# Patient Record
Sex: Female | Born: 1970 | Race: Black or African American | Hispanic: No | Marital: Married | Smoking: Never smoker
Health system: Southern US, Community
[De-identification: ages and names within clinical notes are randomized; demographics above are authoritative.]

## PROBLEM LIST (undated history)

## (undated) DIAGNOSIS — E049 Nontoxic goiter, unspecified: Secondary | ICD-10-CM

## (undated) DIAGNOSIS — I10 Essential (primary) hypertension: Secondary | ICD-10-CM

## (undated) DIAGNOSIS — D649 Anemia, unspecified: Secondary | ICD-10-CM

## (undated) DIAGNOSIS — E119 Type 2 diabetes mellitus without complications: Secondary | ICD-10-CM

## (undated) HISTORY — PX: DILITATION & CURRETTAGE/HYSTROSCOPY WITH NOVASURE ABLATION: SHX5568

## (undated) HISTORY — DX: Type 2 diabetes mellitus without complications: E11.9

## (undated) HISTORY — PX: WISDOM TOOTH EXTRACTION: SHX21

## (undated) HISTORY — DX: Essential (primary) hypertension: I10

## (undated) HISTORY — DX: Morbid (severe) obesity due to excess calories: E66.01

---

## 2005-12-29 ENCOUNTER — Ambulatory Visit (HOSPITAL_COMMUNITY): Admission: RE | Admit: 2005-12-29 | Discharge: 2005-12-29 | Payer: Self-pay | Admitting: Surgery

## 2007-01-10 ENCOUNTER — Ambulatory Visit (HOSPITAL_COMMUNITY): Admission: RE | Admit: 2007-01-10 | Discharge: 2007-01-10 | Payer: Self-pay | Admitting: Surgery

## 2009-12-28 ENCOUNTER — Ambulatory Visit (HOSPITAL_COMMUNITY): Admission: RE | Admit: 2009-12-28 | Discharge: 2009-12-28 | Payer: Self-pay | Admitting: Obstetrics and Gynecology

## 2010-01-25 ENCOUNTER — Ambulatory Visit (HOSPITAL_COMMUNITY): Admission: RE | Admit: 2010-01-25 | Discharge: 2010-01-25 | Payer: Self-pay | Admitting: Obstetrics and Gynecology

## 2010-02-03 ENCOUNTER — Encounter: Admission: RE | Admit: 2010-02-03 | Discharge: 2010-02-03 | Payer: Self-pay | Admitting: Obstetrics and Gynecology

## 2010-04-08 ENCOUNTER — Inpatient Hospital Stay (HOSPITAL_COMMUNITY): Admission: AD | Admit: 2010-04-08 | Discharge: 2010-04-11 | Payer: Self-pay | Admitting: Obstetrics and Gynecology

## 2010-04-09 ENCOUNTER — Encounter (INDEPENDENT_AMBULATORY_CARE_PROVIDER_SITE_OTHER): Payer: Self-pay | Admitting: Obstetrics and Gynecology

## 2010-04-10 ENCOUNTER — Encounter (INDEPENDENT_AMBULATORY_CARE_PROVIDER_SITE_OTHER): Payer: Self-pay | Admitting: Obstetrics and Gynecology

## 2010-04-22 HISTORY — PX: TUBAL LIGATION: SHX77

## 2010-11-05 LAB — CBC
HCT: 32.1 % — ABNORMAL LOW (ref 36.0–46.0)
HCT: 38.7 % (ref 36.0–46.0)
Hemoglobin: 10.8 g/dL — ABNORMAL LOW (ref 12.0–15.0)
MCH: 30 pg (ref 26.0–34.0)
MCV: 91 fL (ref 78.0–100.0)
MCV: 91.1 fL (ref 78.0–100.0)
RBC: 4.25 MIL/uL (ref 3.87–5.11)
WBC: 10.4 10*3/uL (ref 4.0–10.5)
WBC: 12.7 10*3/uL — ABNORMAL HIGH (ref 4.0–10.5)

## 2011-01-05 ENCOUNTER — Encounter (INDEPENDENT_AMBULATORY_CARE_PROVIDER_SITE_OTHER): Payer: Self-pay | Admitting: Surgery

## 2011-03-18 ENCOUNTER — Encounter (INDEPENDENT_AMBULATORY_CARE_PROVIDER_SITE_OTHER): Payer: Self-pay | Admitting: Surgery

## 2011-03-21 ENCOUNTER — Encounter (INDEPENDENT_AMBULATORY_CARE_PROVIDER_SITE_OTHER): Payer: Self-pay | Admitting: Surgery

## 2011-03-21 ENCOUNTER — Ambulatory Visit
Admission: RE | Admit: 2011-03-21 | Discharge: 2011-03-21 | Disposition: A | Discharge status: Home / Self Care | Payer: BC Managed Care – PPO | Source: Ambulatory Visit | Attending: Surgery | Admitting: Surgery

## 2011-03-21 ENCOUNTER — Ambulatory Visit (INDEPENDENT_AMBULATORY_CARE_PROVIDER_SITE_OTHER): Payer: BC Managed Care – PPO | Admitting: Surgery

## 2011-03-21 ENCOUNTER — Other Ambulatory Visit (INDEPENDENT_AMBULATORY_CARE_PROVIDER_SITE_OTHER): Payer: Self-pay | Admitting: Surgery

## 2011-03-21 VITALS — BP 132/86 | HR 62 | Temp 96.4°F | Ht 66.0 in | Wt 249.8 lb

## 2011-03-21 DIAGNOSIS — E042 Nontoxic multinodular goiter: Secondary | ICD-10-CM

## 2011-03-21 NOTE — Progress Notes (Signed)
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 She has been followed in my practice for several years. She has no specific complaints. She has had no recent diagnostic studies other than her ultrasound which was performed today. She has never been on thyroid medication.   PERTINENT REVIEW OF SYSTEMS: Patient denies any compressive symptoms. She denies any dysphagia. She denies tremors. She denies palpitations.   EXAM: HEENT shows her to be normocephalic. Sclerae clear. Pupils equal and reactive. Dentition good. Mucous membranes moist. Voice is normal. Neck is symmetric with extension. Palpation reveals a generous-sized thyroid gland which is nontender. There are no palpable nodules. There are no masses. There is no anterior or posterior cervical lymphadenopathy. There are no supraclavicular masses. Chest is clear to auscultation bilaterally without rales rhonchi or wheeze Cardiac exam shows regular rate and rhythm without murmur peripheral pulses are full Extremities are nontender without edema Neurologically the patient is alert and oriented without focal deficit there is no sign of tremor.   IMPRESSION: Thyroid goiter with bilateral subcentimeter cystic nodules, clinically stable   PLAN: Patient will have a TSH level obtained by her primary care physician this fall. She will forward those results to me for review. The patient will return in 2 years her physical examination. We will repeat her thyroid ultrasound prior to that office visit in

## 2012-09-17 IMAGING — US US SOFT TISSUE HEAD/NECK
1 series · 14 of 25 positions shown · non-contrast
Comparison: 01/10/2007.

CLINICAL DATA: Multiple thyroid nodules.

THYROID ULTRASOUND
TECHNIQUE: Ultrasound examination of the thyroid gland and adjacent
soft tissues was performed.

[Series 1: us soft tissue head/neck · 0.06mm/px · 14 of 47 slices shown]
[im 1/47]
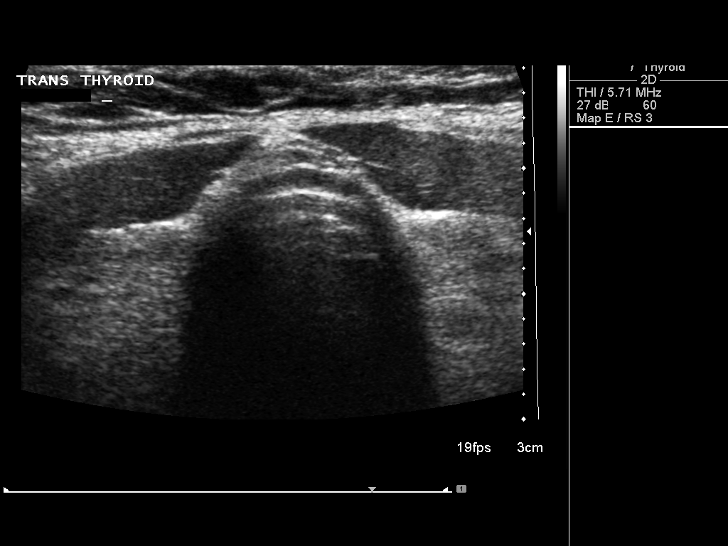
[im 4/47]
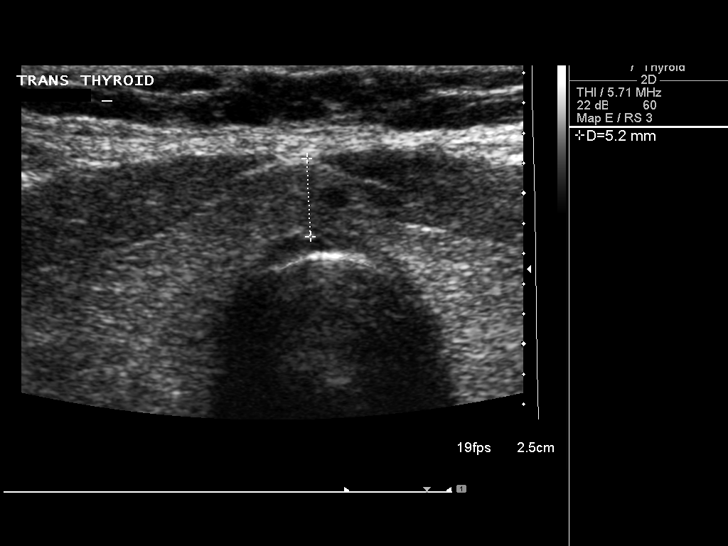
[im 8/47]
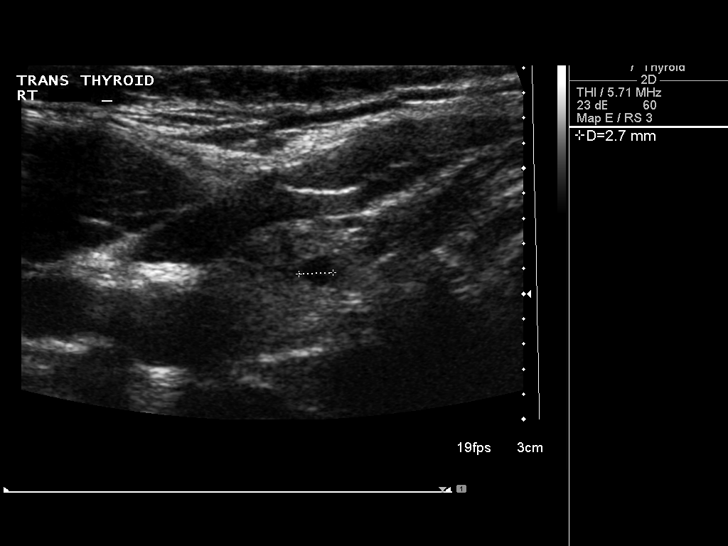
[im 12/47]
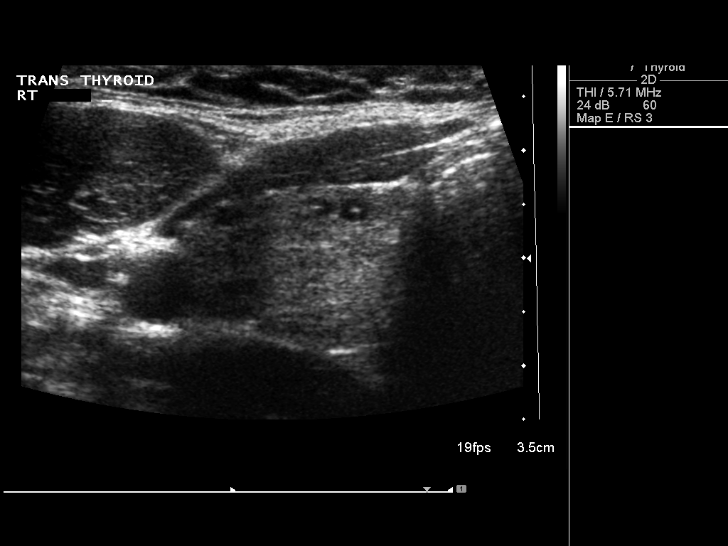
[im 16/47]
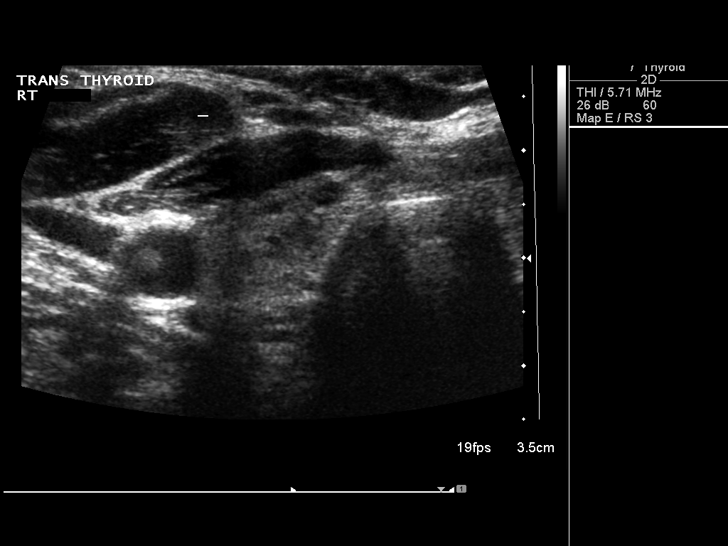
[im 18/47]
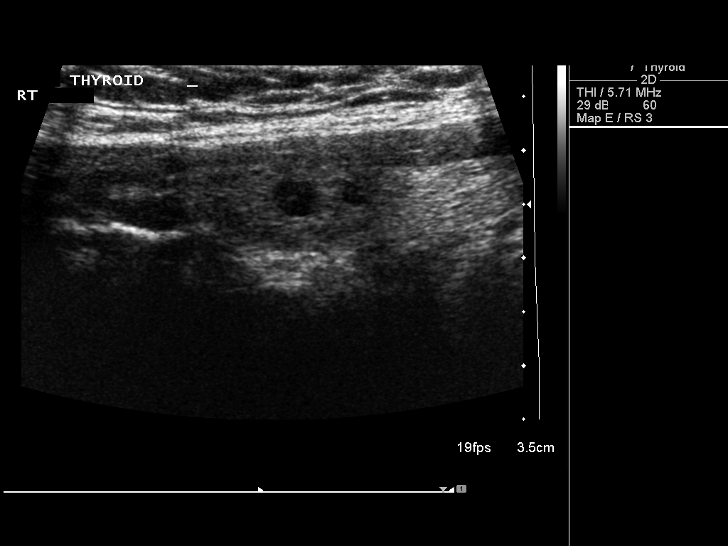
[im 22/47]
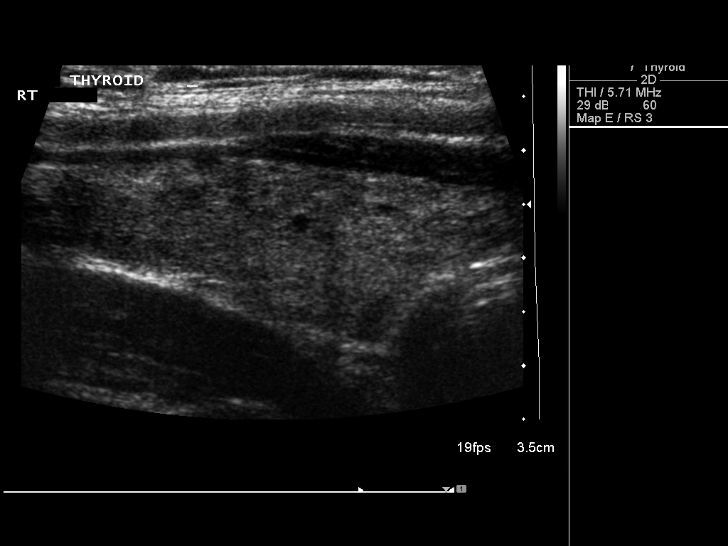
[im 25/47]
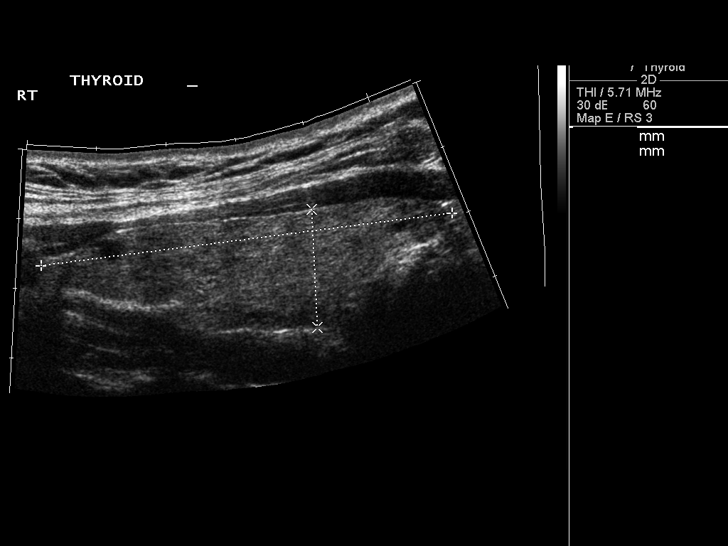
[im 29/47]
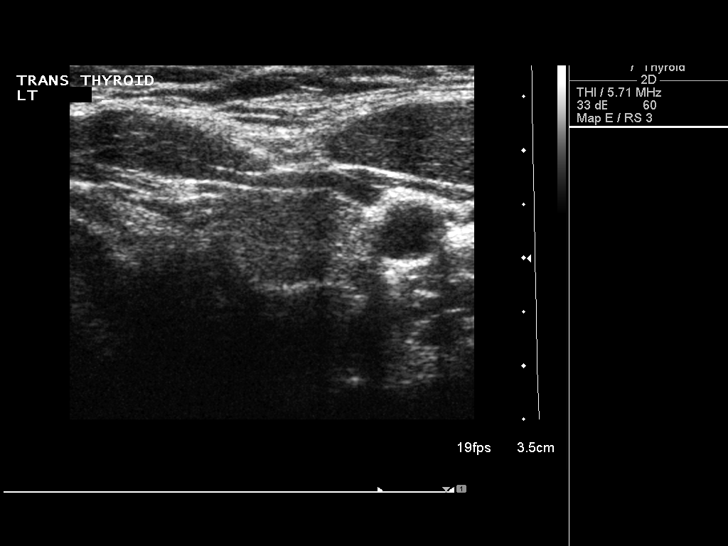
[im 31/47]
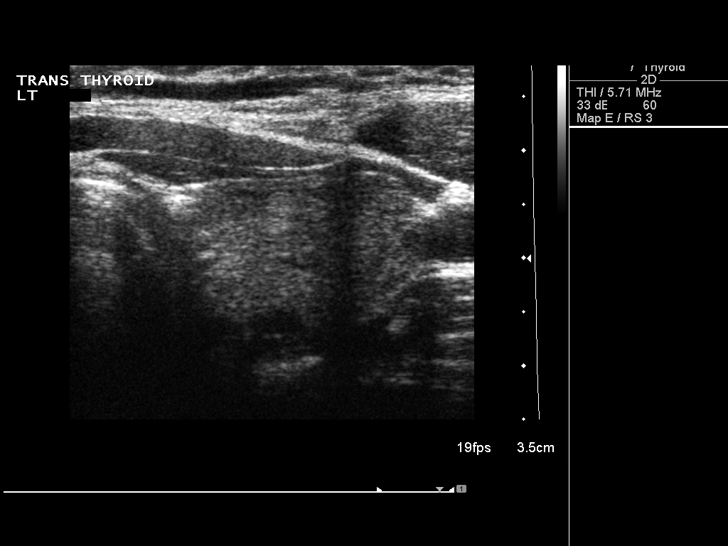
[im 35/47]
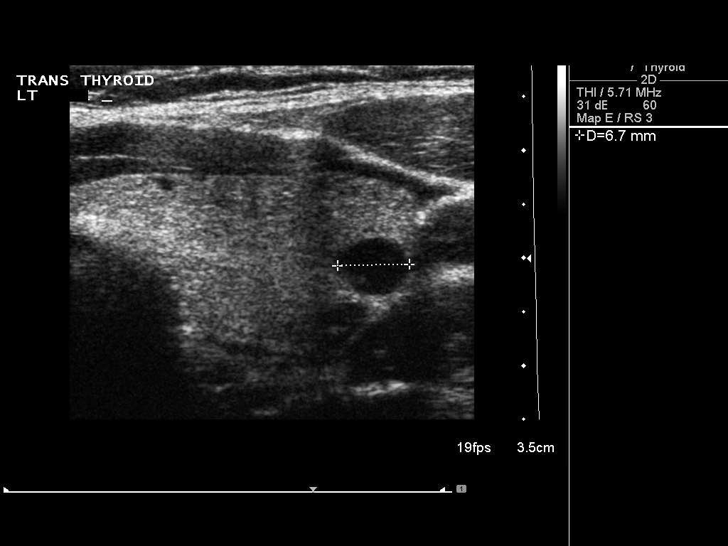
[im 39/47]
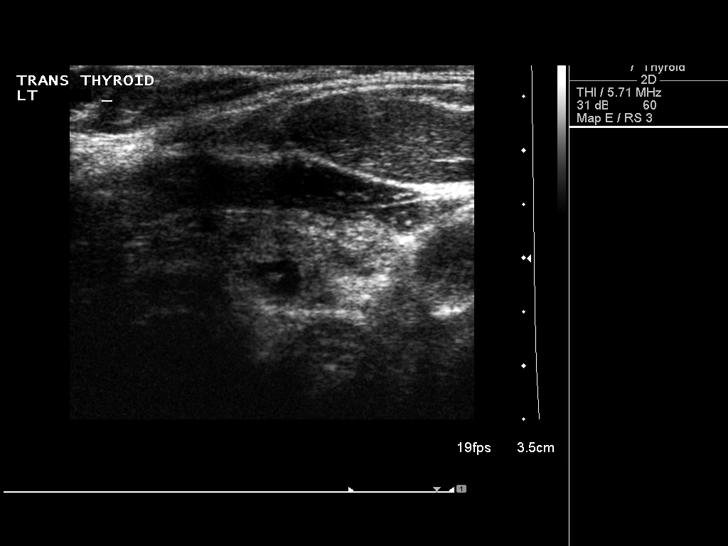
[im 43/47]
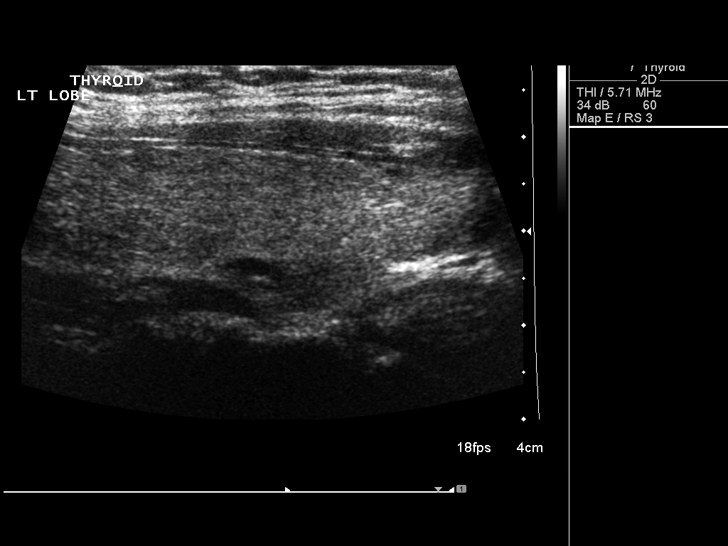
[im 47/47]
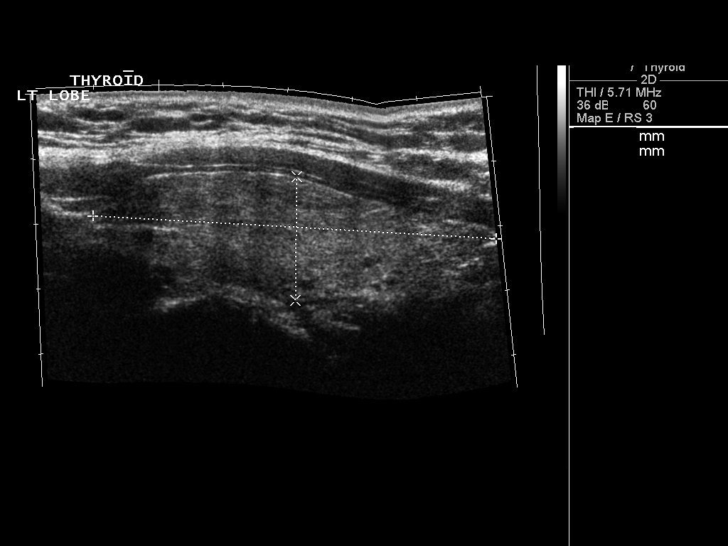

[14 of 25 positions shown; findings below may reference images not displayed]

FINDINGS: Right thyroid lobe:  Measures 5.9 x 1.7 x 2.1 cm (previously 5.4 x
1.6 x 1.8 cm) and is fairly homogeneous in echotexture.
Left thyroid lobe:  Measures 6.2 x 1.9 x 2.4 cm (previously 5.5 x
1.8 x 2.1 cm) and is fairly homogeneous in echotexture.
Isthmus:  Measures 5 mm.

Focal nodules:  Cystic appearing nodules are seen bilaterally,
measuring up to 8 x 6 x 7 mm in the lateral left mid pole, stable.

Lymphadenopathy:  None visualized.
IMPRESSION: Scattered sub centimeter cystic nodules, as before.

## 2013-04-04 ENCOUNTER — Telehealth (INDEPENDENT_AMBULATORY_CARE_PROVIDER_SITE_OTHER): Payer: Self-pay | Admitting: General Surgery

## 2013-04-04 NOTE — Telephone Encounter (Signed)
Spoke with pt and informed her that she is set up for her [place] on 04/12/13 at 2:45 at [facility] radiology.  Also explained she has a f/u appt w/ Gerkin on 04/23/13 at 3:00.

## 2013-04-12 ENCOUNTER — Ambulatory Visit (HOSPITAL_COMMUNITY)
Admission: RE | Admit: 2013-04-12 | Discharge: 2013-04-12 | Disposition: A | Discharge status: Home / Self Care | Payer: BC Managed Care – PPO | Source: Ambulatory Visit | Attending: Surgery | Admitting: Surgery

## 2013-04-12 DIAGNOSIS — E042 Nontoxic multinodular goiter: Secondary | ICD-10-CM

## 2013-04-23 ENCOUNTER — Ambulatory Visit (INDEPENDENT_AMBULATORY_CARE_PROVIDER_SITE_OTHER): Payer: BC Managed Care – PPO | Admitting: Surgery

## 2013-06-05 ENCOUNTER — Ambulatory Visit (INDEPENDENT_AMBULATORY_CARE_PROVIDER_SITE_OTHER): Payer: BC Managed Care – PPO | Admitting: Surgery

## 2013-07-23 ENCOUNTER — Ambulatory Visit (INDEPENDENT_AMBULATORY_CARE_PROVIDER_SITE_OTHER): Payer: BC Managed Care – PPO | Admitting: Surgery

## 2013-09-16 ENCOUNTER — Ambulatory Visit (INDEPENDENT_AMBULATORY_CARE_PROVIDER_SITE_OTHER): Payer: BC Managed Care – PPO | Admitting: Surgery

## 2013-10-09 ENCOUNTER — Ambulatory Visit (INDEPENDENT_AMBULATORY_CARE_PROVIDER_SITE_OTHER): Payer: BC Managed Care – PPO | Admitting: Surgery

## 2013-11-06 ENCOUNTER — Ambulatory Visit (INDEPENDENT_AMBULATORY_CARE_PROVIDER_SITE_OTHER): Payer: BC Managed Care – PPO | Admitting: Surgery

## 2013-11-06 ENCOUNTER — Encounter (INDEPENDENT_AMBULATORY_CARE_PROVIDER_SITE_OTHER): Payer: Self-pay | Admitting: Surgery

## 2013-11-06 VITALS — BP 120/82 | HR 80 | Temp 98.2°F | Resp 14 | Ht 65.0 in | Wt 247.6 lb

## 2013-11-06 DIAGNOSIS — E042 Nontoxic multinodular goiter: Secondary | ICD-10-CM

## 2013-11-06 NOTE — Progress Notes (Signed)
General Surgery [facility]  Chief Complaint  Patient presents with  . 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 She has been followed intermittently for several years. Her most recent thyroid ultrasound was performed in August 2014. This shows a normal size right thyroid lobe at 4.3 cm containing multiple 4 mm hypoechoic nodules. The left thyroid lobe is slightly enlarged at 5.3 cm and contains multiple hypoechoic nodules with the largest measuring 8 mm. All of these findings are stable compared to prior examinations. No worrisome findings were identified. No indication for biopsy.  Patient has a new priamry care physician in [place], [place]. They follow her TSH levels.  PERTINENT REVIEW OF SYSTEMS: Denies compressive symptoms. Denies tremor. Denies palpitations.  EXAM: HEENT: normocephalic; pupils equal and reactive; sclerae clear; dentition good; mucous membranes moist NECK:  No palpable nodules in the thyroid bed; symmetric on extension; no palpable anterior or posterior cervical lymphadenopathy; no supraclavicular masses; no tenderness CHEST: clear to auscultation bilaterally without rales, rhonchi, or wheezes CARDIAC: regular rate and rhythm without significant murmur; peripheral pulses are full EXT:  non-tender without edema; no deformity NEURO: no gross focal deficits; no sign of tremor   IMPRESSION: Bilateral thyroid nodules, subcentimeter, clinically stable  PLAN: Patient has a small multinodular goiter. This has been stable for many years. There are no worrisome features.  Patient does not require further surgical evaluation. She does not require further ultrasound examinations.  Patient should have an annual physical examination by her primary care physician and should have an annual TSH level determination.  Patient will return for surgical  care as needed.  Earnstine Regal, MD, [facility] Office: [phone]  Visit Diagnoses: 1. Multinodular goiter (nontoxic)

## 2013-11-06 NOTE — Patient Instructions (Signed)

## 2014-10-10 IMAGING — US US SOFT TISSUE HEAD/NECK
1 series · 14 of 25 positions shown · non-contrast
Comparison: 03/21/2011 hyphen 12/29/2005.

CLINICAL DATA: 42-year-old female with thyroid nodules, goiter.

EXAM:
THYROID ULTRASOUND
TECHNIQUE: Ultrasound examination of the thyroid gland and adjacent soft
tissues was performed.

[Series 1: us soft tissue head/neck · 0.08mm/px · 14 of 40 slices shown]
[im 1/40]
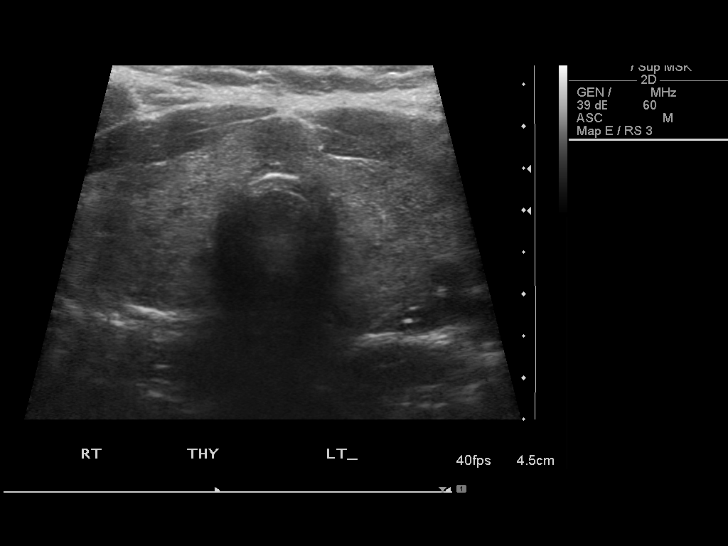
[im 4/40]
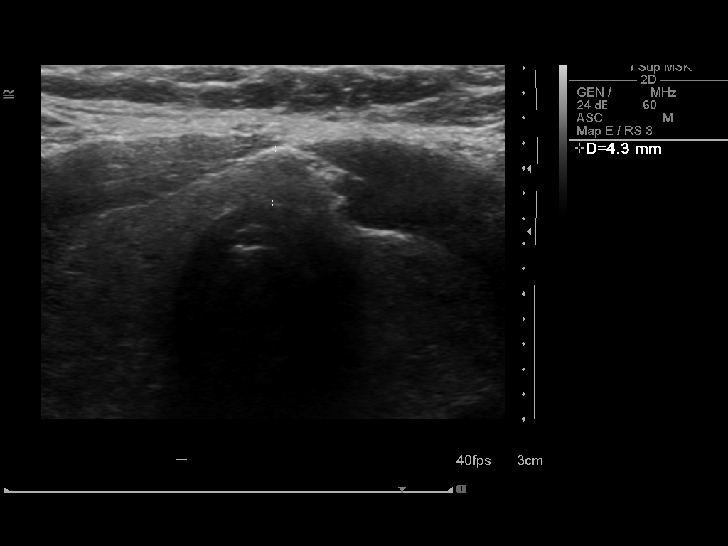
[im 7/40]
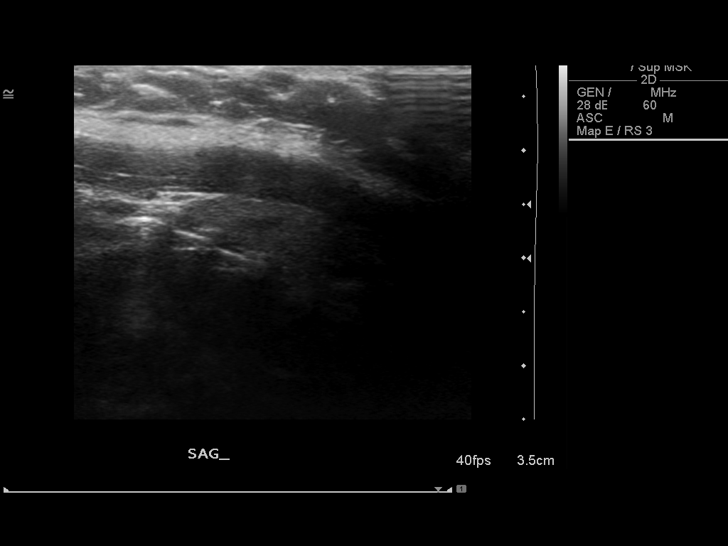
[im 10/40]
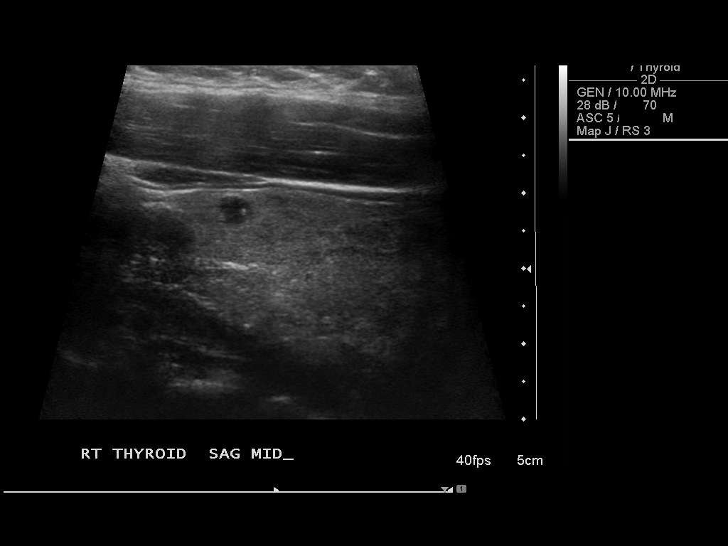
[im 14/40]
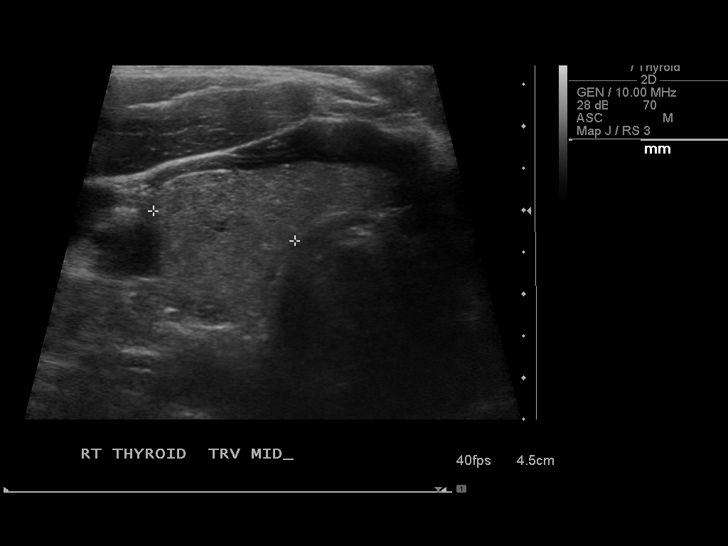
[im 15/40]
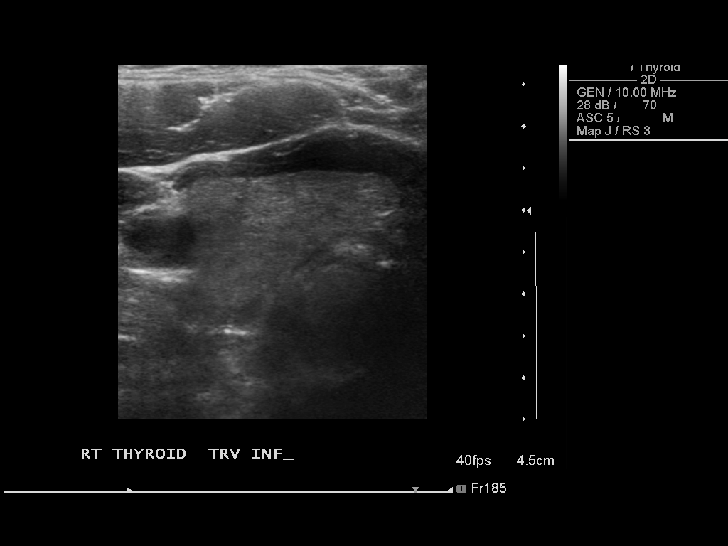
[im 18/40]
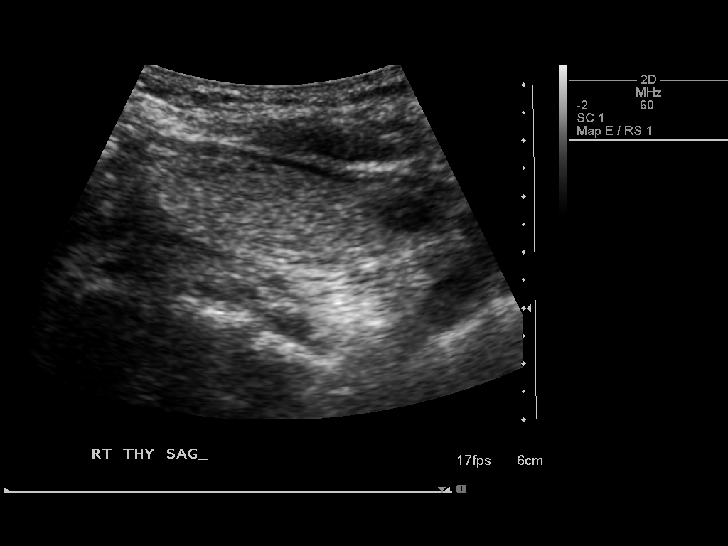
[im 22/40]
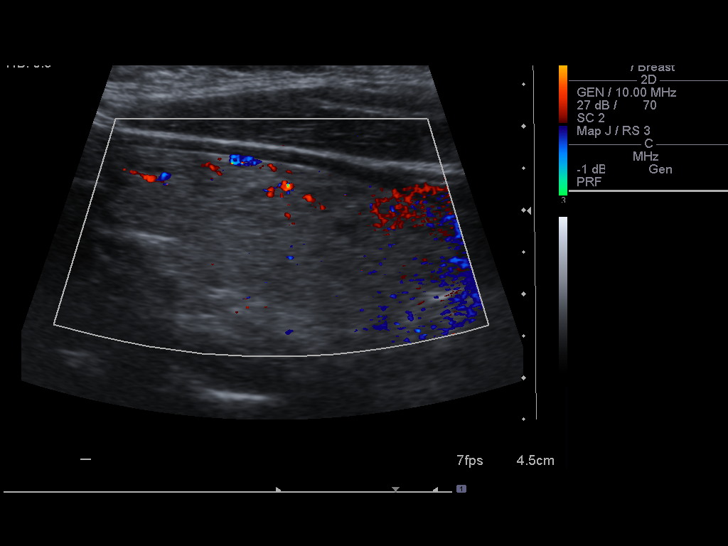
[im 25/40]
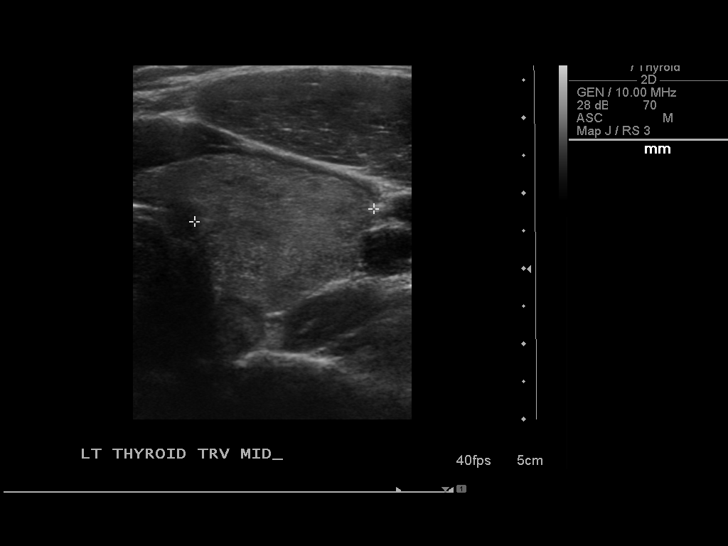
[im 27/40]
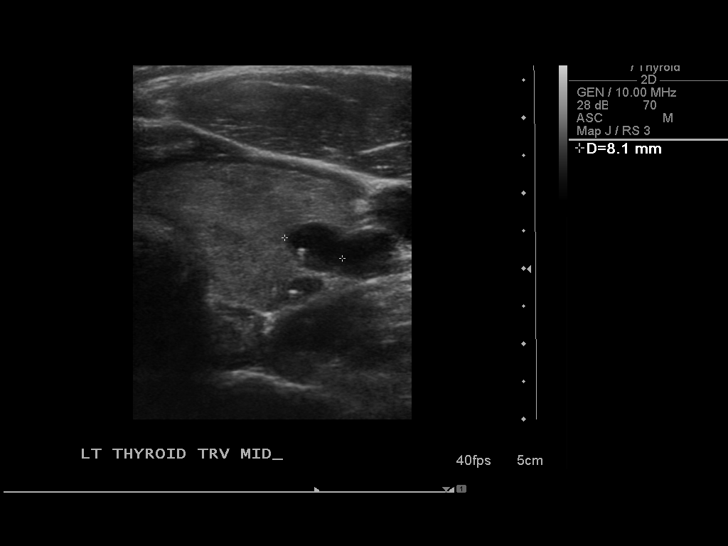
[im 30/40]
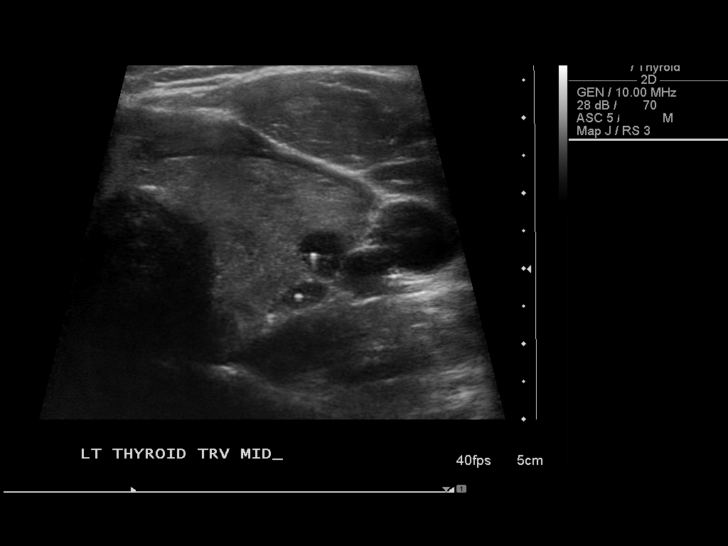
[im 33/40]
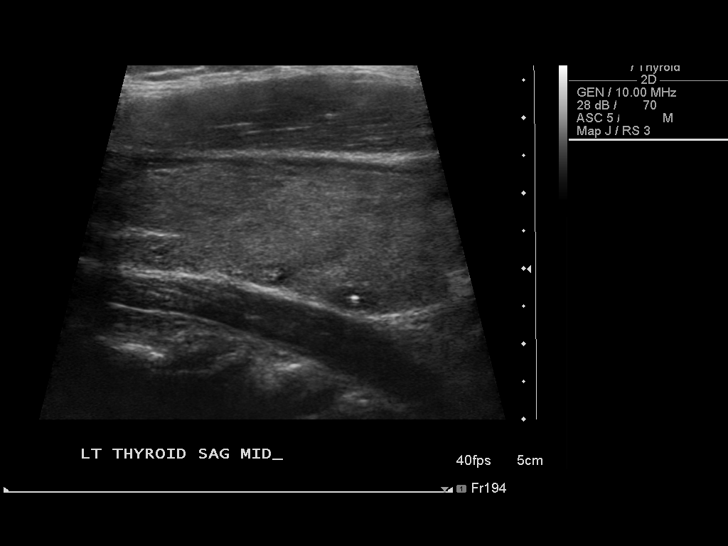
[im 36/40]
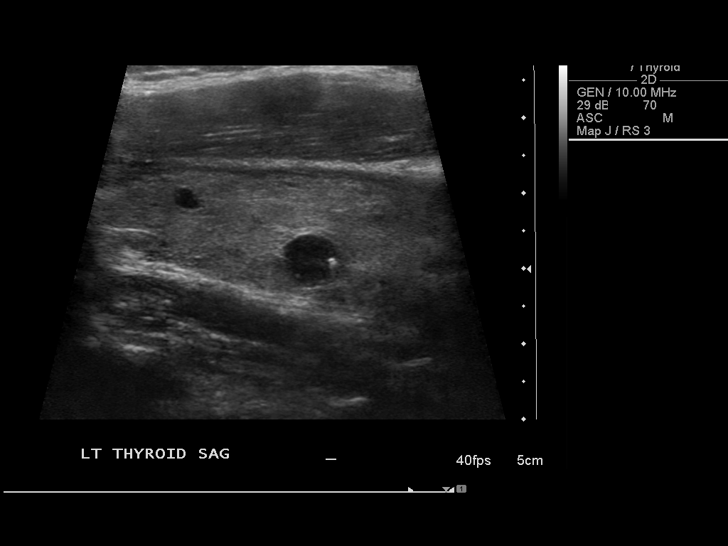
[im 40/40]
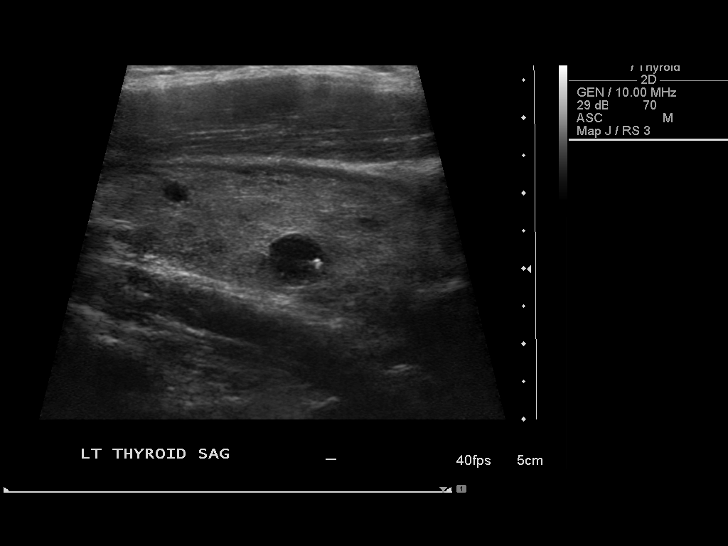

[14 of 25 positions shown; findings below may reference images not displayed]

FINDINGS: Right thyroid lobe: 4.3 x 1.8 x 1.7 cm

Left thyroid lobe:  5.3 x 2.1 x 2.4 cm.

Isthmus:  4 mm.

Focal nodules:

Right: Multiple small hypoechoic nodules measure up to 4 mm and are
stable.

Isthmus:  Stable 4 mm hypoechoic nodule.

Left: Multiple small hypoechoic nodules, the largest measuring 8 mm,
stable and compatible with colloid cyst with internal crystals.

Lymphadenopathy:  None visualized.
IMPRESSION: Stable small hypoechoic thyroid nodules, the largest most compatible
with benign colloid cysts.

Findings do not meet current SRU consensus criteria for biopsy.

Follow-up by clinical exam is recommended. If patient has known risk
factors for thyroid carcinoma, consider follow-up ultrasound in 12
months. If patient is clinically hyperthyroid, consider nuclear
medicine thyroid uptake and scan.Reference: Management of Thyroid
Nodules Detected at US: Society of Radiologists in Ultrasound

## 2018-01-02 NOTE — Patient Instructions (Addendum)
Your procedure is scheduled on: Monday, June 3  Enter through the [employer] [facility] at: 6 am  Pick up the phone at the desk and dial [phone].  Call this number if you have problems the morning of surgery: [phone].  Remember: Do NOT eat food or Do NOT drink clear liquids (including water) after midnight Sunday.  Take these medicines the morning of surgery with a SIP OF WATER: None  Stop vitamin supplements, herbal medications and ibuprofen/NSAIDS 1 week prior to surgery.  Do NOT wear jewelry (body piercing), metal hair clips/bobby pins, make-up, or nail polish. Do NOT wear lotions, powders, or perfumes.  You may wear deoderant. Do NOT shave for 48 hours prior to surgery. Do NOT bring valuables to the hospital.  Leave suitcase in car.  After surgery it may be brought to your room.  For patients admitted to the hospital, checkout time is 11:00 AM the day of discharge. Home with Husband Lu Duffel cell [phone]

## 2018-01-10 ENCOUNTER — Encounter (HOSPITAL_COMMUNITY): Payer: Self-pay | Admitting: *Deleted

## 2018-01-10 ENCOUNTER — Encounter (HOSPITAL_COMMUNITY)
Admission: RE | Admit: 2018-01-10 | Discharge: 2018-01-10 | Disposition: A | Discharge status: Home / Self Care | Payer: BLUE CROSS/BLUE SHIELD | Source: Ambulatory Visit | Attending: Obstetrics and Gynecology | Admitting: Obstetrics and Gynecology

## 2018-01-10 ENCOUNTER — Other Ambulatory Visit: Payer: Self-pay

## 2018-01-10 DIAGNOSIS — Z01812 Encounter for preprocedural laboratory examination: Secondary | ICD-10-CM | POA: Insufficient documentation

## 2018-01-10 HISTORY — DX: Anemia, unspecified: D64.9

## 2018-01-10 HISTORY — DX: Nontoxic goiter, unspecified: E04.9

## 2018-01-10 LAB — CBC
HCT: 40 % (ref 36.0–46.0)
HEMOGLOBIN: 13.3 g/dL (ref 12.0–15.0)
MCH: 28.9 pg (ref 26.0–34.0)
MCHC: 33.3 g/dL (ref 30.0–36.0)
MCV: 87 fL (ref 78.0–100.0)
Platelets: 220 10*3/uL (ref 150–400)
RBC: 4.6 MIL/uL (ref 3.87–5.11)
RDW: 13.2 % (ref 11.5–15.5)
WBC: 7.5 10*3/uL (ref 4.0–10.5)

## 2018-01-10 LAB — COMPREHENSIVE METABOLIC PANEL
ALK PHOS: 45 U/L (ref 38–126)
ALT: 27 U/L (ref 14–54)
AST: 29 U/L (ref 15–41)
Albumin: 4.1 g/dL (ref 3.5–5.0)
Anion gap: 11 (ref 5–15)
BUN: 11 mg/dL (ref 6–20)
CALCIUM: 9.3 mg/dL (ref 8.9–10.3)
CO2: 22 mmol/L (ref 22–32)
CREATININE: 0.81 mg/dL (ref 0.44–1.00)
Chloride: 103 mmol/L (ref 101–111)
Glucose, Bld: 94 mg/dL (ref 65–99)
Potassium: 3.8 mmol/L (ref 3.5–5.1)
Sodium: 136 mmol/L (ref 135–145)
Total Bilirubin: 0.8 mg/dL (ref 0.3–1.2)
Total Protein: 8.4 g/dL — ABNORMAL HIGH (ref 6.5–8.1)

## 2018-01-10 LAB — ABO/RH: ABO/RH(D): B POS

## 2018-01-10 NOTE — H&P (Addendum)
Kathy Bowers is an 47 y.o. female presents for surgical mngt of pelvic pain and fibroids.  S/p ablation and menorrhagia has improved but now having severe pelvic pain and cramps requiring pain medications and missing work.  Menstrual History: No LMP recorded.    No past medical history on file.  Past Surgical History:  Procedure Laterality Date  . TUBAL LIGATION  04/2010  . WISDOM TOOTH EXTRACTION      Family History  Problem Relation Age of Onset  . Heart disease Father     Social History:  reports that she has never smoked. She has never used smokeless tobacco. She reports that she does not drink alcohol or use drugs.  Allergies:  Allergies  Allergen Reactions  . Augmentin [Amoxicillin-Pot Clavulanate] Nausea Only    Has patient had a PCN reaction causing immediate rash, facial/tongue/throat swelling, SOB or lightheadedness with hypotension: No Has patient had a PCN reaction causing severe rash involving mucus membranes or skin necrosis: No Has patient had a PCN reaction that required hospitalization: No Has patient had a PCN reaction occurring within the last 10 years: No If all of the above answers are "NO", then may proceed with Cephalosporin use.   Marland Kitchen Oxycodone Nausea And Vomiting   ROS  AV, VSS Physical Exam  Gen - NAD Abd - soft, NT obese CV - RRR Lungs - clear Ext - NT PV - uterus mobile, NT.  No adnexal masses  PV CT:  Abdomen/GI tract without abnormalities.  Small left ovarian cyst.  Fibroid uterus  Assessment/Plan:  Post ablative  Pelvic pain LAVH/BS, possible TAH R/b/a discussed, questions answered, informed consent Pt has pain L>R - desires left ovary removed if looks abnormal  Marylynn Pearson 01/10/2018, 1:10 PM

## 2018-01-20 NOTE — Anesthesia Preprocedure Evaluation (Addendum)
Anesthesia Evaluation  Patient identified by MRN, date of birth, ID band Patient awake    Reviewed: Allergy & Precautions, NPO status , Patient's Chart, lab work & pertinent test results  History of Anesthesia Complications Negative for: history of anesthetic complications  Airway Mallampati: III  TM Distance: >3 FB Neck ROM: Full    Dental no notable dental hx. (+) Dental Advisory Given   Pulmonary neg pulmonary ROS,    Pulmonary exam normal        Cardiovascular negative cardio ROS Normal cardiovascular exam     Neuro/Psych negative neurological ROS     GI/Hepatic negative GI ROS, Neg liver ROS,   Endo/Other  Morbid obesity  Renal/GU negative Renal ROS     Musculoskeletal negative musculoskeletal ROS (+)   Abdominal   Peds  Hematology negative hematology ROS (+)   Anesthesia Other Findings Day of surgery medications reviewed with the patient.  Reproductive/Obstetrics                            Anesthesia Physical Anesthesia Plan  ASA: III  Anesthesia Plan: General   Post-op Pain Management:    Induction: Intravenous  PONV Risk Score and Plan: 4 or greater and Ondansetron, Dexamethasone and Scopolamine patch - Pre-op  Airway Management Planned: Oral ETT  Additional Equipment:   Intra-op Plan:   Post-operative Plan: Extubation in OR  Informed Consent: I have reviewed the patients History and Physical, chart, labs and discussed the procedure including the risks, benefits and alternatives for the proposed anesthesia with the patient or authorized representative who has indicated his/her understanding and acceptance.   Dental advisory given  Plan Discussed with: Anesthesiologist and CRNA  Anesthesia Plan Comments:        Anesthesia Quick Evaluation

## 2018-01-22 ENCOUNTER — Observation Stay (HOSPITAL_COMMUNITY)
Admission: RE | Admit: 2018-01-22 | Discharge: 2018-01-25 | DRG: 742 | Disposition: A | Discharge status: Home / Self Care | Payer: BLUE CROSS/BLUE SHIELD | Attending: Obstetrics and Gynecology | Admitting: Obstetrics and Gynecology

## 2018-01-22 ENCOUNTER — Inpatient Hospital Stay (HOSPITAL_COMMUNITY): Payer: BLUE CROSS/BLUE SHIELD | Admitting: Anesthesiology

## 2018-01-22 ENCOUNTER — Encounter (HOSPITAL_COMMUNITY): Payer: Self-pay | Admitting: *Deleted

## 2018-01-22 ENCOUNTER — Other Ambulatory Visit: Payer: Self-pay

## 2018-01-22 ENCOUNTER — Encounter (HOSPITAL_COMMUNITY)
Admission: RE | Disposition: A | Discharge status: Home / Self Care | Payer: Self-pay | Source: Home / Self Care | Attending: Obstetrics and Gynecology

## 2018-01-22 DIAGNOSIS — N800 Endometriosis of uterus: Secondary | ICD-10-CM | POA: Diagnosis not present

## 2018-01-22 DIAGNOSIS — R102 Pelvic and perineal pain: Secondary | ICD-10-CM | POA: Diagnosis not present

## 2018-01-22 DIAGNOSIS — Z880 Allergy status to penicillin: Secondary | ICD-10-CM | POA: Insufficient documentation

## 2018-01-22 DIAGNOSIS — Z6841 Body Mass Index (BMI) 40.0 and over, adult: Secondary | ICD-10-CM | POA: Diagnosis not present

## 2018-01-22 DIAGNOSIS — D259 Leiomyoma of uterus, unspecified: Secondary | ICD-10-CM | POA: Diagnosis present

## 2018-01-22 DIAGNOSIS — N802 Endometriosis of fallopian tube: Secondary | ICD-10-CM | POA: Diagnosis not present

## 2018-01-22 DIAGNOSIS — Z885 Allergy status to narcotic agent status: Secondary | ICD-10-CM | POA: Insufficient documentation

## 2018-01-22 HISTORY — PX: LAPAROSCOPIC VAGINAL HYSTERECTOMY WITH SALPINGECTOMY: SHX6680

## 2018-01-22 SURGERY — HYSTERECTOMY, VAGINAL, LAPAROSCOPY-ASSISTED, WITH SALPINGECTOMY
Anesthesia: General | Site: Abdomen | Laterality: Bilateral

## 2018-01-22 MED ORDER — MENTHOL 3 MG MT LOZG: 1.0000 | LOZENGE | OROMUCOSAL | Status: DC | PRN

## 2018-01-22 MED ORDER — SCOPOLAMINE 1 MG/3DAYS TD PT72
MEDICATED_PATCH | TRANSDERMAL | Status: AC
  Filled 2018-01-22: qty 1

## 2018-01-22 MED ORDER — ACETAMINOPHEN 10 MG/ML IV SOLN: 1000.0000 mg | Freq: Once | INTRAVENOUS | Status: AC

## 2018-01-22 MED ORDER — KETOROLAC TROMETHAMINE 30 MG/ML IJ SOLN
30.0000 mg | Freq: Three times a day (TID) | INTRAMUSCULAR | Status: AC
  Administered 2018-01-22 – 2018-01-23 (×3): 30 mg via INTRAVENOUS
  Filled 2018-01-22 (×3): qty 1

## 2018-01-22 MED ORDER — FENTANYL CITRATE (PF) 100 MCG/2ML IJ SOLN
INTRAMUSCULAR | Status: AC
  Filled 2018-01-22: qty 2

## 2018-01-22 MED ORDER — SCOPOLAMINE 1 MG/3DAYS TD PT72
1.0000 | MEDICATED_PATCH | Freq: Once | TRANSDERMAL | Status: DC
  Administered 2018-01-22: 1.5 mg via TRANSDERMAL

## 2018-01-22 MED ORDER — ROCURONIUM BROMIDE 100 MG/10ML IV SOLN
INTRAVENOUS | Status: DC | PRN
  Administered 2018-01-22: 40 mg via INTRAVENOUS
  Administered 2018-01-22: 10 mg via INTRAVENOUS

## 2018-01-22 MED ORDER — DEXAMETHASONE SODIUM PHOSPHATE 10 MG/ML IJ SOLN
INTRAMUSCULAR | Status: AC
  Filled 2018-01-22: qty 1

## 2018-01-22 MED ORDER — KETOROLAC TROMETHAMINE 30 MG/ML IJ SOLN
INTRAMUSCULAR | Status: AC
  Filled 2018-01-22: qty 1

## 2018-01-22 MED ORDER — PROPOFOL 10 MG/ML IV BOLUS
INTRAVENOUS | Status: DC | PRN
  Administered 2018-01-22: 20 mg via INTRAVENOUS
  Administered 2018-01-22: 180 mg via INTRAVENOUS

## 2018-01-22 MED ORDER — GENTAMICIN SULFATE 40 MG/ML IJ SOLN
INTRAMUSCULAR | Status: DC
  Filled 2018-01-22: qty 14.5

## 2018-01-22 MED ORDER — ONDANSETRON HCL 4 MG/2ML IJ SOLN
INTRAMUSCULAR | Status: DC | PRN
  Administered 2018-01-22: 4 mg via INTRAVENOUS

## 2018-01-22 MED ORDER — ONDANSETRON HCL 4 MG PO TABS: 4.0000 mg | ORAL_TABLET | Freq: Four times a day (QID) | ORAL | Status: DC | PRN

## 2018-01-22 MED ORDER — IBUPROFEN 600 MG PO TABS
600.0000 mg | ORAL_TABLET | Freq: Four times a day (QID) | ORAL | Status: DC | PRN
  Administered 2018-01-23 – 2018-01-25 (×7): 600 mg via ORAL
  Filled 2018-01-22 (×7): qty 1

## 2018-01-22 MED ORDER — HYDROCODONE-ACETAMINOPHEN 5-325 MG PO TABS
1.0000 | ORAL_TABLET | ORAL | Status: DC | PRN
  Administered 2018-01-23 (×2): 2 via ORAL
  Administered 2018-01-23: 1 via ORAL
  Administered 2018-01-23: 2 via ORAL
  Administered 2018-01-24 (×2): 1 via ORAL
  Administered 2018-01-24 (×3): 2 via ORAL
  Administered 2018-01-25 (×2): 1 via ORAL
  Filled 2018-01-22: qty 2
  Filled 2018-01-22 (×3): qty 1
  Filled 2018-01-22 (×5): qty 2
  Filled 2018-01-22 (×2): qty 1

## 2018-01-22 MED ORDER — SODIUM CHLORIDE 0.9 % IV SOLN
INTRAVENOUS | Status: AC
  Filled 2018-01-22: qty 2

## 2018-01-22 MED ORDER — LACTATED RINGERS IV SOLN: INTRAVENOUS | Status: DC

## 2018-01-22 MED ORDER — FENTANYL CITRATE (PF) 250 MCG/5ML IJ SOLN
INTRAMUSCULAR | Status: AC
  Filled 2018-01-22: qty 5

## 2018-01-22 MED ORDER — ONDANSETRON HCL 4 MG/2ML IJ SOLN
4.0000 mg | Freq: Four times a day (QID) | INTRAMUSCULAR | Status: DC | PRN
  Administered 2018-01-22: 4 mg via INTRAVENOUS
  Filled 2018-01-22: qty 2

## 2018-01-22 MED ORDER — SUGAMMADEX SODIUM 200 MG/2ML IV SOLN
INTRAVENOUS | Status: DC | PRN
  Administered 2018-01-22: 200 mg via INTRAVENOUS

## 2018-01-22 MED ORDER — MIDAZOLAM HCL 2 MG/2ML IJ SOLN
INTRAMUSCULAR | Status: AC
  Filled 2018-01-22: qty 2

## 2018-01-22 MED ORDER — HYDROMORPHONE HCL 1 MG/ML IJ SOLN
1.0000 mg | INTRAMUSCULAR | Status: DC | PRN
  Administered 2018-01-22 (×2): 0.5 mg via INTRAVENOUS
  Filled 2018-01-22 (×2): qty 1

## 2018-01-22 MED ORDER — ONDANSETRON HCL 4 MG/2ML IJ SOLN
INTRAMUSCULAR | Status: AC
  Filled 2018-01-22: qty 2

## 2018-01-22 MED ORDER — FENTANYL CITRATE (PF) 100 MCG/2ML IJ SOLN
INTRAMUSCULAR | Status: AC
  Administered 2018-01-22: 25 ug via INTRAVENOUS
  Filled 2018-01-22: qty 2

## 2018-01-22 MED ORDER — LIDOCAINE HCL (CARDIAC) PF 100 MG/5ML IV SOSY
PREFILLED_SYRINGE | INTRAVENOUS | Status: DC | PRN
  Administered 2018-01-22: 80 mg via INTRAVENOUS

## 2018-01-22 MED ORDER — MIDAZOLAM HCL 2 MG/2ML IJ SOLN
INTRAMUSCULAR | Status: DC | PRN
  Administered 2018-01-22 (×2): 1 mg via INTRAVENOUS

## 2018-01-22 MED ORDER — LIDOCAINE HCL (CARDIAC) PF 100 MG/5ML IV SOSY
PREFILLED_SYRINGE | INTRAVENOUS | Status: AC
  Filled 2018-01-22: qty 5

## 2018-01-22 MED ORDER — ALBUMIN HUMAN 5 % IV SOLN
12.5000 g | Freq: Once | INTRAVENOUS | Status: AC
  Administered 2018-01-22: 12.5 g via INTRAVENOUS
  Filled 2018-01-22: qty 250

## 2018-01-22 MED ORDER — LACTATED RINGERS IV SOLN
INTRAVENOUS | Status: DC
  Administered 2018-01-22 (×3): via INTRAVENOUS

## 2018-01-22 MED ORDER — KETOROLAC TROMETHAMINE 30 MG/ML IJ SOLN
INTRAMUSCULAR | Status: DC | PRN
  Administered 2018-01-22: 30 mg via INTRAVENOUS

## 2018-01-22 MED ORDER — SUGAMMADEX SODIUM 200 MG/2ML IV SOLN
INTRAVENOUS | Status: AC
  Filled 2018-01-22: qty 2

## 2018-01-22 MED ORDER — ROCURONIUM BROMIDE 100 MG/10ML IV SOLN
INTRAVENOUS | Status: AC
  Filled 2018-01-22: qty 1

## 2018-01-22 MED ORDER — BUPIVACAINE HCL (PF) 0.25 % IJ SOLN
INTRAMUSCULAR | Status: DC | PRN
  Administered 2018-01-22: 2 mL

## 2018-01-22 MED ORDER — PROPOFOL 10 MG/ML IV BOLUS
INTRAVENOUS | Status: AC
  Filled 2018-01-22: qty 20

## 2018-01-22 MED ORDER — FENTANYL CITRATE (PF) 100 MCG/2ML IJ SOLN
25.0000 ug | INTRAMUSCULAR | Status: DC | PRN
  Administered 2018-01-22: 25 ug via INTRAVENOUS
  Administered 2018-01-22: 50 ug via INTRAVENOUS
  Administered 2018-01-22 (×3): 25 ug via INTRAVENOUS

## 2018-01-22 MED ORDER — FENTANYL CITRATE (PF) 100 MCG/2ML IJ SOLN
INTRAMUSCULAR | Status: DC | PRN
  Administered 2018-01-22 (×5): 50 ug via INTRAVENOUS

## 2018-01-22 MED ORDER — CEFOTETAN DISODIUM-DEXTROSE 2-2.08 GM-%(50ML) IV SOLR
2.0000 g | Freq: Once | INTRAVENOUS | Status: AC
Start: 2018-01-22 — End: 2018-01-22
  Administered 2018-01-22: 2 g via INTRAVENOUS

## 2018-01-22 MED ORDER — SODIUM CHLORIDE 0.9 % IR SOLN
Status: DC | PRN
  Administered 2018-01-22: 3000 mL

## 2018-01-22 MED ORDER — ACETAMINOPHEN 10 MG/ML IV SOLN
INTRAVENOUS | Status: AC
  Filled 2018-01-22: qty 100

## 2018-01-22 MED ORDER — PROMETHAZINE HCL 25 MG/ML IJ SOLN: 6.2500 mg | INTRAMUSCULAR | Status: DC | PRN

## 2018-01-22 MED ORDER — DEXAMETHASONE SODIUM PHOSPHATE 10 MG/ML IJ SOLN
INTRAMUSCULAR | Status: DC | PRN
  Administered 2018-01-22: 10 mg via INTRAVENOUS

## 2018-01-22 MED ORDER — BUPIVACAINE HCL (PF) 0.25 % IJ SOLN
INTRAMUSCULAR | Status: AC
  Filled 2018-01-22: qty 30

## 2018-01-22 MED ORDER — LACTATED RINGERS IV SOLN
INTRAVENOUS | Status: DC
  Administered 2018-01-22 – 2018-01-23 (×2): via INTRAVENOUS

## 2018-01-22 SURGICAL SUPPLY — 44 items
ADH SKN CLS APL DERMABOND .7 (GAUZE/BANDAGES/DRESSINGS) ×2
CABLE HIGH FREQUENCY MONO STRZ (ELECTRODE) IMPLANT
CANISTER SUCT 3000ML PPV (MISCELLANEOUS) ×2 IMPLANT
COAGULATOR SUCT SWTCH 10FR 6 (ELECTROSURGICAL) ×1 IMPLANT
COVER BACK TABLE 60X90IN (DRAPES) ×2 IMPLANT
COVER MAYO STAND STRL (DRAPES) ×2 IMPLANT
DECANTER SPIKE VIAL GLASS SM (MISCELLANEOUS) ×1 IMPLANT
DERMABOND ADVANCED (GAUZE/BANDAGES/DRESSINGS) ×2
DERMABOND ADVANCED .7 DNX12 (GAUZE/BANDAGES/DRESSINGS) ×2 IMPLANT
DRSG OPSITE POSTOP 3X4 (GAUZE/BANDAGES/DRESSINGS) ×1 IMPLANT
DURAPREP 26ML APPLICATOR (WOUND CARE) ×2 IMPLANT
ELECT REM PT RETURN 9FT ADLT (ELECTROSURGICAL) ×2
ELECTRODE REM PT RTRN 9FT ADLT (ELECTROSURGICAL) IMPLANT
GLOVE BIO SURGEON STRL SZ 6.5 (GLOVE) ×4 IMPLANT
GLOVE BIOGEL PI IND STRL 6.5 (GLOVE) ×1 IMPLANT
GLOVE BIOGEL PI IND STRL 7.0 (GLOVE) ×5 IMPLANT
GLOVE BIOGEL PI INDICATOR 6.5 (GLOVE) ×1
GLOVE BIOGEL PI INDICATOR 7.0 (GLOVE) ×5
LEGGING LITHOTOMY PAIR STRL (DRAPES) ×2 IMPLANT
NS IRRIG 1000ML POUR BTL (IV SOLUTION) ×2 IMPLANT
PACK LAVH (CUSTOM PROCEDURE TRAY) ×2 IMPLANT
PACK ROBOTIC GOWN (GOWN DISPOSABLE) ×2 IMPLANT
PACK TRENDGUARD 450 HYBRID PRO (MISCELLANEOUS) IMPLANT
PACK TRENDGUARD 600 HYBRD PROC (MISCELLANEOUS) IMPLANT
PROTECTOR NERVE ULNAR (MISCELLANEOUS) ×4 IMPLANT
SEALER TISSUE G2 CVD JAW 45CM (ENDOMECHANICALS) ×2 IMPLANT
SET IRRIG TUBING LAPAROSCOPIC (IRRIGATION / IRRIGATOR) ×1 IMPLANT
SUT MNCRL 0 MO-4 VIOLET 18 CR (SUTURE) ×2 IMPLANT
SUT MNCRL 0 VIOLET 6X18 (SUTURE) ×1 IMPLANT
SUT MON AB 2-0 CT1 36 (SUTURE) ×2 IMPLANT
SUT MON AB 3-0 SH 27 (SUTURE)
SUT MON AB 3-0 SH27 (SUTURE) IMPLANT
SUT MONOCRYL 0 6X18 (SUTURE) ×1
SUT MONOCRYL 0 MO 4 18  CR/8 (SUTURE) ×3
SUT VIC AB 3-0 PS2 18 (SUTURE) ×2
SUT VIC AB 3-0 PS2 18XBRD (SUTURE) ×1 IMPLANT
SUT VICRYL 0 UR6 27IN ABS (SUTURE) ×2 IMPLANT
TOWEL OR 17X24 6PK STRL BLUE (TOWEL DISPOSABLE) ×4 IMPLANT
TRAY FOLEY W/BAG SLVR 14FR (SET/KITS/TRAYS/PACK) ×2 IMPLANT
TRENDGUARD 450 HYBRID PRO PACK (MISCELLANEOUS)
TRENDGUARD 600 HYBRID PROC PK (MISCELLANEOUS) ×2
TROCAR OPTI TIP 5M 100M (ENDOMECHANICALS) ×2 IMPLANT
TROCAR XCEL NON-BLD 11X100MML (ENDOMECHANICALS) ×2 IMPLANT
WARMER LAPAROSCOPE (MISCELLANEOUS) ×2 IMPLANT

## 2018-01-22 NOTE — Transfer of Care (Signed)
Immediate Anesthesia Transfer of Care Note  Patient: Kathy Bowers  Procedure(s) Performed: LAPAROSCOPIC ASSISTED VAGINAL HYSTERECTOMY WITH SALPINGECTOMY (Bilateral Abdomen)  Patient Location: PACU  Anesthesia Type:General  Level of Consciousness: sedated  Airway & Oxygen Therapy: Patient Spontanous Breathing and Patient connected to nasal cannula oxygen  Post-op Assessment: Report given to RN  Post vital signs: Reviewed and stable  Last Vitals:  Vitals Value Taken Time  BP    Temp    Pulse    Resp    SpO2      Last Pain:  Vitals:   01/22/18 0621  TempSrc: Oral  PainSc: 2       Patients Stated Pain Goal: 2 (07/86/75 4492)  Complications: No apparent anesthesia complications

## 2018-01-22 NOTE — Anesthesia Postprocedure Evaluation (Signed)
Anesthesia Post Note  Patient: Kathy Bowers  Procedure(s) Performed: LAPAROSCOPIC ASSISTED VAGINAL HYSTERECTOMY WITH SALPINGECTOMY (Bilateral Abdomen)     Patient location during evaluation: [facility] Unit Anesthesia Type: General Level of consciousness: awake Pain management: satisfactory to patient Vital Signs Assessment: post-procedure vital signs reviewed and stable Respiratory status: spontaneous breathing Cardiovascular status: stable Anesthetic complications: no    Last Vitals:  Vitals:   01/22/18 1458 01/22/18 1705  BP: (!) 109/58 130/77  Pulse: 88 99  Resp: 16 14  Temp: 36.4 C 36.6 C  SpO2: 100% 100%    Last Pain:  Vitals:   01/22/18 1705  TempSrc: Oral  PainSc:    Pain Goal: Patients Stated Pain Goal: 3 (01/22/18 1450)               Casimer Lanius

## 2018-01-22 NOTE — Addendum Note (Signed)
Addendum  created 01/22/18 1822 by Asher Muir, CRNA   Sign clinical note

## 2018-01-22 NOTE — Anesthesia Postprocedure Evaluation (Signed)
Anesthesia Post Note  Patient: Kathy Bowers  Procedure(s) Performed: LAPAROSCOPIC ASSISTED VAGINAL HYSTERECTOMY WITH SALPINGECTOMY (Bilateral Abdomen)     Patient location during evaluation: PACU Anesthesia Type: General Level of consciousness: sedated Pain management: pain level controlled Vital Signs Assessment: post-procedure vital signs reviewed and stable Respiratory status: spontaneous breathing and respiratory function stable Cardiovascular status: stable Postop Assessment: no apparent nausea or vomiting Anesthetic complications: no    Last Vitals:  Vitals:   01/22/18 1345 01/22/18 1358  BP: (!) 102/56 (!) 101/53  Pulse: 82 88  Resp: 11 16  Temp:  (!) 36.2 C  SpO2: 100% 100%    Last Pain:  Vitals:   01/22/18 1345  TempSrc:   PainSc: Asleep   Pain Goal: Patients Stated Pain Goal: 2 (01/22/18 1220)               Wyatt Galvan DANIEL

## 2018-01-22 NOTE — Brief Op Note (Signed)
01/22/2018  9:20 AM  PATIENT:  [name]  47 y.o. female  PRE-OPERATIVE DIAGNOSIS:  fibroid, pelvic pain  POST-OPERATIVE DIAGNOSIS:  fibroid, pelvic pain  PROCEDURE:  Procedure(s): LAPAROSCOPIC ASSISTED VAGINAL HYSTERECTOMY WITH SALPINGECTOMY (Bilateral)  SURGEON:  Surgeon(s) and Role:    * Marylynn Pearson, MD - Primary    * Arvella Nigh, MD - Assisting   ANESTHESIA:   general  EBL:  350 mL   BLOOD ADMINISTERED:none  DRAINS: foley   LOCAL MEDICATIONS USED:  MARCAINE     SPECIMEN:  Source of Specimen:  uterus and bilateral fallopian tubes  DISPOSITION OF SPECIMEN:  PATHOLOGY  COUNTS:  YES  TOURNIQUET:  * No tourniquets in log *  DICTATION: .Other Dictation: Dictation Number pending  PATIENT DISPOSITION:  PACU - hemodynamically stable.   Delay start of Pharmacological VTE agent (>24hrs) due to surgical blood loss or risk of bleeding: no

## 2018-01-22 NOTE — Anesthesia Procedure Notes (Signed)
Procedure Name: Intubation Date/Time: 01/22/2018 7:29 AM Performed by: Asher Muir, CRNA Pre-anesthesia Checklist: Patient identified, Emergency Drugs available, Suction available and Patient being monitored Patient Re-evaluated:Patient Re-evaluated prior to induction Oxygen Delivery Method: Circle system utilized and Simple face mask Preoxygenation: Pre-oxygenation with 100% oxygen Induction Type: IV induction Ventilation: Mask ventilation without difficulty Laryngoscope Size: Mac and 3 Grade View: Grade II Tube type: Oral Tube size: 7.0 mm Number of attempts: 1 Airway Equipment and Method: Stylet Placement Confirmation: ETT inserted through vocal cords under direct vision,  positive ETCO2 and breath sounds checked- equal and bilateral Secured at: 20 (right lip) cm Tube secured with: Tape Dental Injury: Teeth and Oropharynx as per pre-operative assessment

## 2018-01-22 NOTE — Progress Notes (Signed)
Day of Surgery Procedure(s) (LRB): LAPAROSCOPIC ASSISTED VAGINAL HYSTERECTOMY WITH SALPINGECTOMY (Bilateral)  Subjective: Patient reports incisional pain.  Pain controlled with IV dilaudid.  Will advance diet to see if can advance to PO pain mngt.  Pt with low BP in PACU - improved with IV albumin per anesthesia.    Objective: I have reviewed patient's vital signs and intake and output. UOP >100cc/hr  General: alert and cooperative GI: normal findings: soft, non-tender Extremities: extremities normal, atraumatic, no cyanosis or edema  Assessment: s/p Procedure(s): LAPAROSCOPIC ASSISTED VAGINAL HYSTERECTOMY WITH SALPINGECTOMY (Bilateral): stable  Plan: Advance diet Encourage ambulation Advance to PO medication  LOS: 0 days    Marylynn Pearson 01/22/2018, 4:36 PM

## 2018-01-23 ENCOUNTER — Encounter (HOSPITAL_COMMUNITY): Payer: Self-pay | Admitting: Obstetrics and Gynecology

## 2018-01-23 DIAGNOSIS — R102 Pelvic and perineal pain: Secondary | ICD-10-CM | POA: Diagnosis not present

## 2018-01-23 LAB — CBC
HCT: 20.1 % — ABNORMAL LOW (ref 36.0–46.0)
HEMATOCRIT: 20.6 % — AB (ref 36.0–46.0)
Hemoglobin: 6.8 g/dL — CL (ref 12.0–15.0)
Hemoglobin: 7.1 g/dL — ABNORMAL LOW (ref 12.0–15.0)
MCH: 29.2 pg (ref 26.0–34.0)
MCH: 29.3 pg (ref 26.0–34.0)
MCHC: 33.8 g/dL (ref 30.0–36.0)
MCHC: 34.5 g/dL (ref 30.0–36.0)
MCV: 86.3 fL (ref 78.0–100.0)
MCV: 85.1 fL (ref 78.0–100.0)
PLATELETS: 194 10*3/uL (ref 150–400)
Platelets: 185 10*3/uL (ref 150–400)
RBC: 2.33 MIL/uL — AB (ref 3.87–5.11)
RBC: 2.42 MIL/uL — ABNORMAL LOW (ref 3.87–5.11)
RDW: 12.8 % (ref 11.5–15.5)
RDW: 13.4 % (ref 11.5–15.5)
WBC: 11.7 10*3/uL — AB (ref 4.0–10.5)
WBC: 13.9 10*3/uL — AB (ref 4.0–10.5)

## 2018-01-23 MED ORDER — DOCUSATE SODIUM 100 MG PO CAPS
100.0000 mg | ORAL_CAPSULE | Freq: Two times a day (BID) | ORAL | Status: DC
  Administered 2018-01-23 – 2018-01-25 (×5): 100 mg via ORAL
  Filled 2018-01-23 (×5): qty 1

## 2018-01-23 MED ORDER — SIMETHICONE 80 MG PO CHEW
80.0000 mg | CHEWABLE_TABLET | Freq: Four times a day (QID) | ORAL | Status: DC | PRN
  Administered 2018-01-23 – 2018-01-24 (×4): 80 mg via ORAL
  Filled 2018-01-23 (×4): qty 1

## 2018-01-23 MED ORDER — FERROUS SULFATE 325 (65 FE) MG PO TABS
325.0000 mg | ORAL_TABLET | Freq: Two times a day (BID) | ORAL | Status: DC
  Administered 2018-01-23 – 2018-01-25 (×4): 325 mg via ORAL
  Filled 2018-01-23 (×5): qty 1

## 2018-01-23 MED ORDER — SODIUM CHLORIDE 0.9 % IV SOLN
510.0000 mg | Freq: Once | INTRAVENOUS | Status: AC
  Administered 2018-01-23: 510 mg via INTRAVENOUS
  Filled 2018-01-23: qty 17

## 2018-01-23 NOTE — Op Note (Signed)
NAMEKEILANI, Kathy Bowers MEDICAL RECORD [id number] ACCOUNT [id number] DATE OF BIRTH:1971/08/05 FACILITY: [facility] LOCATION: [id number] PHYSICIAN:Jerid Catherman, MD  OPERATIVE REPORT  DATE OF PROCEDURE:  01/22/2018  PREOPERATIVE DIAGNOSIS:  Pelvic pain.  POSTOPERATIVE DIAGNOSES:  Pelvic pain, fibroids and endometriosis.  PROCEDURE:  Laparoscopic-assisted vaginal hysterectomy with bilateral salpingectomy.  SURGEON:  Marylynn Pearson, MD  ASSISTANT:  Arvella Nigh, MD  ESTIMATED BLOOD LOSS:  350 mL.  SPECIMEN:  Uterus with cervix and bilateral fallopian tubes.  ANESTHESIA:  General.  DESCRIPTION OF PROCEDURE:  After informed consent, the patient was taken to the operating room where she was given general anesthesia.  She was placed in the dorsal lithotomy position using Allen stirrups.  She was prepped and draped in sterile fashion  and a Foley catheter was inserted sterilely.  Bivalve speculum was placed in the vagina, and an acorn tenaculum placed on the cervix.  The speculum was removed, and our attention was turned to the abdomen.  Marcaine 0.25% was used to provide local  anesthesia at the site of our abdominal incisions, and a small infraumbilical skin incision was made with a scalpel.  This was extended bluntly to the level of the fascia using a Kelly clamp.  Optical trocar was then inserted under direct visualization.   Survey was performed.  A left fallopian tube appeared dilated with thin filmy adhesions overlying and a small implant of endometriosis.  Bilateral ovaries appeared normal.  The uterus was slightly enlarged with fibroids.  A suprapubic incision was made,  and a 5 mm trocar was inserted under direct visualization.  The uterus was manipulated to the patient's left.  The right fallopian tube was grasped with an atraumatic grasper, and the EnSeal device was used to clamp, cauterize, and cut the mesosalpinx.   This was extended to the uterus, and then the broad ligament was  clamped, cauterized, and cut to the round ligament.  The round ligament was clamped, cauterized, and cut.  Excellent hemostasis was noted, and the procedure was repeated on the left side.   The EnSeal device was used to take down any thin filmy adhesions overlying the dilated left fallopian tube.  Excellent hemostasis was noted bilaterally.  All instruments were removed from the abdomen, and our attention was turned to the vagina.  Acorn and single-tooth tenaculum were removed, a weighted speculum was placed, and a circumferential incision was made around the cervix.  The posterior cul-de-sac was entered sharply with curved Mayo scissors, and bilateral uterosacral ligaments were  clamped, cut, and suture ligated.  Anterior cul-de-sac was entered sharply, and a Deaver was placed anteriorly.  Uterine artery and cardinal ligaments were serially clamped, cut, and suture ligated.  The uterus was delivered through the posterior  cul-de-sac.  Remaining pedicles were clamped, cut, and suture ligated.  The posterior vaginal cuff was run using a running locked suture of Monocryl, and then the vaginal cuff was reapproximated using a series of figure-of-eight sutures of Monocryl.   Excellent hemostasis was noted, and our attention was returned to the abdomen.  All pedicles were reinspected, and the pelvis was irrigated.  All pedicles and the vaginal cuff were hemostatic.  All instruments and trocars were removed.  A deep stitch was placed in the infraumbilical incision, and the skin was closed with Vicryl.   Sponge, lap, needle counts were correct x2,  LN/NUANCE  D:01/23/2018 T:01/23/2018 JOB:[id number]

## 2018-01-23 NOTE — Progress Notes (Addendum)
1 Day Post-Op Procedure(s) (LRB): LAPAROSCOPIC ASSISTED VAGINAL HYSTERECTOMY WITH SALPINGECTOMY (Bilateral)  Subjective: Patient reports incisional pain and tolerating PO.  Ambulating but feeling weak and lighheaded w/ ambulation.  Voiding.    Objective: I have reviewed patient's vital signs, intake and output, medications and labs.  General: alert and cooperative GI: normal findings: soft, non-tender  Repeat Hgb 7.1.   Assessment: s/p Procedure(s): LAPAROSCOPIC ASSISTED VAGINAL HYSTERECTOMY WITH SALPINGECTOMY (Bilateral): stable  Plan: Advance diet Encourage ambulation Advance to PO medication Discontinue IV fluids  Anemia - postop from blood loss. Continue PO iron  Declines iron transfusion Anticipate discharge in am  LOS: 1 day    Kathy Bowers 01/23/2018, 12:57 PM

## 2018-01-23 NOTE — Progress Notes (Signed)
1 Day Post-Op Procedure(s) (LRB): LAPAROSCOPIC ASSISTED VAGINAL HYSTERECTOMY WITH SALPINGECTOMY (Bilateral)  Subjective: Patient reports incisional pain and tolerating PO.    Objective: I have reviewed patient's vital signs, intake and output, medications and labs.  General: alert and cooperative Cardio: regular rate and rhythm GI: normal findings: soft, non-tender Vaginal Bleeding: none  Assessment: s/p Procedure(s): LAPAROSCOPIC ASSISTED VAGINAL HYSTERECTOMY WITH SALPINGECTOMY (Bilateral): stable and progressing well  Plan: Advance diet Encourage ambulation Advance to PO medication Discontinue IV fluids  Recheck CBC at noon.  LOS: 1 day    Kathy Bowers 01/23/2018, 6:57 AM

## 2018-01-23 NOTE — Progress Notes (Signed)
CRITICAL VALUE ALERT  Critical Value: Hg   Date & Time Notied:  01/23/18, 9574   Provider Notified: Dr. Julien Girt  Orders Received/Actions taken: Provider reviewing

## 2018-01-23 NOTE — Anesthesia Postprocedure Evaluation (Signed)
Anesthesia Post Note  Patient: Kathy Bowers  Procedure(s) Performed: LAPAROSCOPIC ASSISTED VAGINAL HYSTERECTOMY WITH SALPINGECTOMY (Bilateral Abdomen)     Patient location during evaluation: Women's Unit Anesthesia Type: General Level of consciousness: awake Pain management: satisfactory to patient Vital Signs Assessment: post-procedure vital signs reviewed and stable Respiratory status: spontaneous breathing Cardiovascular status: stable Anesthetic complications: no    Last Vitals:  Vitals:   01/23/18 0400 01/23/18 0739  BP: 116/67 108/61  Pulse: (!) 101 99  Resp: 16 18  Temp: 37.4 C 36.8 C  SpO2: 100% 99%    Last Pain:  Vitals:   01/23/18 0739  TempSrc: Oral  PainSc:    Pain Goal: Patients Stated Pain Goal: 3 (01/23/18 0730) Reassured pt that while she is sensitive to narcotics, that she did fine under general anesthetics.               Casimer Lanius

## 2018-01-24 DIAGNOSIS — R102 Pelvic and perineal pain: Secondary | ICD-10-CM | POA: Diagnosis not present

## 2018-01-24 LAB — HEMOGLOBIN AND HEMATOCRIT, BLOOD
HCT: 26 % — ABNORMAL LOW (ref 36.0–46.0)
Hemoglobin: 9 g/dL — ABNORMAL LOW (ref 12.0–15.0)

## 2018-01-24 LAB — PREPARE RBC (CROSSMATCH)

## 2018-01-24 MED ORDER — BISACODYL 5 MG PO TBEC
5.0000 mg | DELAYED_RELEASE_TABLET | Freq: Every day | ORAL | Status: DC | PRN
  Filled 2018-01-24: qty 1

## 2018-01-24 MED ORDER — BISACODYL 10 MG RE SUPP
10.0000 mg | Freq: Once | RECTAL | Status: AC
  Administered 2018-01-24: 10 mg via RECTAL
  Filled 2018-01-24: qty 1

## 2018-01-24 MED ORDER — SODIUM CHLORIDE 0.9 % IV SOLN
Freq: Once | INTRAVENOUS | Status: AC
  Administered 2018-01-24: 09:00:00 via INTRAVENOUS

## 2018-01-24 NOTE — Progress Notes (Signed)
2 Days Post-Op Procedure(s) (LRB): LAPAROSCOPIC ASSISTED VAGINAL HYSTERECTOMY WITH SALPINGECTOMY (Bilateral)  Subjective: Patient reports incisional pain, tolerating PO, + flatus and no problems voiding.  Feeling better today.  Pain controlled with po meds.  Feeling palpitations with ambulations  Objective: I have reviewed patient's vital signs, intake and output, medications and labs.  General: alert and cooperative GI: normal findings: soft, non-tender Extremities: extremities normal, atraumatic, no cyanosis or edema Vaginal Bleeding: none   Results for orders placed or performed during the hospital encounter of 01/22/18 (from the past 24 hour(s))  CBC     Status: Abnormal   Collection Time: 01/23/18 11:42 AM  Result Value Ref Range   WBC 13.9 (H) 4.0 - 10.5 K/uL   RBC 2.42 (L) 3.87 - 5.11 MIL/uL   Hemoglobin 7.1 (L) 12.0 - 15.0 g/dL   HCT 20.6 (L) 36.0 - 46.0 %   MCV 85.1 78.0 - 100.0 fL   MCH 29.3 26.0 - 34.0 pg   MCHC 34.5 30.0 - 36.0 g/dL   RDW 13.4 11.5 - 15.5 %   Platelets 185 150 - 400 K/uL  CBC     Status: Abnormal   Collection Time: 01/24/18  5:40 AM  Result Value Ref Range   WBC 9.2 4.0 - 10.5 K/uL   RBC 2.12 (L) 3.87 - 5.11 MIL/uL   Hemoglobin 6.3 (LL) 12.0 - 15.0 g/dL   HCT 18.3 (L) 36.0 - 46.0 %   MCV 86.3 78.0 - 100.0 fL   MCH 29.7 26.0 - 34.0 pg   MCHC 34.4 30.0 - 36.0 g/dL   RDW 13.8 11.5 - 15.5 %   Platelets 159 150 - 400 K/uL     Assessment: s/p Procedure(s): LAPAROSCOPIC ASSISTED VAGINAL HYSTERECTOMY WITH SALPINGECTOMY (Bilateral): stable  Post op anemia - s/p iron infusion yesterday.  Drop overnight - pt symptomatic.  rec blood transfusion.  Risks reviewed and informed consent obtained.    LOS: 2 days    Marylynn Pearson 01/24/2018, 8:00 AM

## 2018-01-24 NOTE — Progress Notes (Signed)
Results for RISSA, TURLEY (MRN [id number]) as of 01/24/2018 06:42  Ref. Range 01/24/2018 05:40  Hemoglobin Latest Ref Range: 12.0 - 15.0 g/dL 6.3 (LL)  CRITICAL VALUE ALERT  Critical Value: 6.3  Date & Time Notied: 01/24/18 0630  Provider Notified:Dr Tomblin  Orders Received/Actions taken: none

## 2018-01-24 NOTE — Progress Notes (Signed)
2 Days Post-Op Procedure(s) (LRB): LAPAROSCOPIC ASSISTED VAGINAL HYSTERECTOMY WITH SALPINGECTOMY (Bilateral)  Subjective: Patient reports incisional pain, tolerating PO, + flatus and no problems voiding.    Objective: I have reviewed patient's vital signs, intake and output, medications and labs.  General: alert and cooperative GI: normal findings: soft, non-tender  Assessment: s/p Procedure(s): LAPAROSCOPIC ASSISTED VAGINAL HYSTERECTOMY WITH SALPINGECTOMY (Bilateral): stable  Plan: continue transfusion  Check H/H this afternoon and rpt CBC in am  LOS: 2 days    Marylynn Pearson 01/24/2018, 1:27 PM

## 2018-01-24 NOTE — Progress Notes (Signed)
CRITICAL VALUE ALERT  Critical Value: Hg 6.3  Date & Time Notied: 01/24/18  Provider Notified: Primary RN Bess Kinds, will review  Orders Received/Actions taken: will review

## 2018-01-25 DIAGNOSIS — R102 Pelvic and perineal pain: Secondary | ICD-10-CM | POA: Diagnosis not present

## 2018-01-25 LAB — BPAM RBC
BLOOD PRODUCT EXPIRATION DATE: 201906232359
BLOOD PRODUCT EXPIRATION DATE: 201906272359
ISSUE DATE / TIME: 201906050901
ISSUE DATE / TIME: 201906051130
UNIT TYPE AND RH: 5100
Unit Type and Rh: 5100

## 2018-01-25 LAB — CBC
HCT: 27.3 % — ABNORMAL LOW (ref 36.0–46.0)
HEMATOCRIT: 18.3 % — AB (ref 36.0–46.0)
Hemoglobin: 6.3 g/dL — CL (ref 12.0–15.0)
Hemoglobin: 9.2 g/dL — ABNORMAL LOW (ref 12.0–15.0)
MCH: 29.7 pg (ref 26.0–34.0)
MCH: 29.3 pg (ref 26.0–34.0)
MCHC: 34.4 g/dL (ref 30.0–36.0)
MCHC: 33.7 g/dL (ref 30.0–36.0)
MCV: 86.3 fL (ref 78.0–100.0)
MCV: 86.9 fL (ref 78.0–100.0)
PLATELETS: 156 10*3/uL (ref 150–400)
Platelets: 159 10*3/uL (ref 150–400)
RBC: 2.12 MIL/uL — ABNORMAL LOW (ref 3.87–5.11)
RBC: 3.14 MIL/uL — ABNORMAL LOW (ref 3.87–5.11)
RDW: 13.8 % (ref 11.5–15.5)
RDW: 13.8 % (ref 11.5–15.5)
WBC: 9.2 10*3/uL (ref 4.0–10.5)
WBC: 8.3 10*3/uL (ref 4.0–10.5)

## 2018-01-25 LAB — TYPE AND SCREEN
ABO/RH(D): B POS
ANTIBODY SCREEN: NEGATIVE
Unit division: 0
Unit division: 0

## 2018-01-25 MED ORDER — HYDROCODONE-ACETAMINOPHEN 5-325 MG PO TABS: 1.0000 | ORAL_TABLET | ORAL | 0 refills | Status: AC | PRN

## 2018-01-25 MED ORDER — IBUPROFEN 600 MG PO TABS: 600.0000 mg | ORAL_TABLET | Freq: Four times a day (QID) | ORAL | 0 refills | Status: AC | PRN

## 2018-01-25 NOTE — Progress Notes (Signed)
Discharge teaching complete with pt. Pt understood all information and did not have any questions. 

## 2018-01-25 NOTE — Discharge Summary (Signed)
Physician Discharge Summary  Patient ID: Kathy Bowers MRN: [id number] DOB/AGE: Jan 03, 1971 47 y.o.  Admit date: 01/22/2018 Discharge date: 01/25/2018  Admission Diagnoses:  Fibroids, pelvic pain  Discharge Diagnoses:  Active Problems:   Pelvic pain   Discharged Condition: stable  Hospital Course: Pt was admitted for routine postop care.  Post op day 1 Hgb was decreased at 6.8 and pt ws given iron infusion.  Feeling better and able to ambulate and void without difficulty.  Hgb POD2 was 6.3 and blood transfusion was recommended.  Pt accepted and 2 u transfused.  Pain was controlled with PO meds and she was able to tolerate a regular diet.  + flatus.  After transfusion, Hgb stable at 9 & 9.2.    Consults: None  Significant Diagnostic Studies: labs: cbc  Treatments: surgery: LAVH.BS  Discharge Exam: Blood pressure 120/75, pulse 75, temperature 98.5 F (36.9 C), temperature source Oral, resp. rate 20, height 5\' 4"  (1.626 m), weight 253 lb (114.8 kg), SpO2 100 %. Gen - NAD Abd - soft, NT/ND  Inc c/d/i Ext - NT  Disposition: Discharge disposition: 01-Home or Self Care        Allergies as of 01/25/2018      Reactions   Augmentin [amoxicillin-pot Clavulanate] Nausea Only   Has patient had a PCN reaction causing immediate rash, facial/tongue/throat swelling, SOB or lightheadedness with hypotension: No Has patient had a PCN reaction causing severe rash involving mucus membranes or skin necrosis: No Has patient had a PCN reaction that required hospitalization: No Has patient had a PCN reaction occurring within the last 10 years: No If all of the above answers are "NO", then may proceed with Cephalosporin use.   Oxycodone Nausea And Vomiting      Medication List    STOP taking these medications   naproxen 500 MG tablet Commonly known as:  NAPROSYN   neomycin-bacitracin-polymyxin ointment Commonly known as:  NEOSPORIN     TAKE these medications   calcium carbonate 500 MG  chewable tablet Commonly known as:  TUMS - dosed in mg elemental calcium Chew 1 tablet by mouth daily as needed for indigestion or heartburn.   cholecalciferol 1000 units tablet Commonly known as:  VITAMIN D Take 1,000 Units by mouth daily.   HAIR SKIN AND NAILS FORMULA Tabs Take 3 tablets by mouth daily.   HYDROcodone-acetaminophen 5-325 MG tablet Commonly known as:  NORCO/VICODIN Take 1-2 tablets by mouth every 4 (four) hours as needed for moderate pain.   ibuprofen 600 MG tablet Commonly known as:  ADVIL,MOTRIN Take 1 tablet (600 mg total) by mouth every 6 (six) hours as needed for moderate pain (after completes toradol).   TURMERIC CURCUMIN PO Take 750 mg by mouth daily.      Follow-up Information    Marylynn Pearson, MD. Schedule an appointment as soon as possible for a visit in 1 week(s).   Specialty:  Obstetrics and Gynecology Contact information: [address], [place] [address] [phone]           Signed: Marylynn Pearson 01/25/2018, 8:08 AM

## 2021-10-13 ENCOUNTER — Ambulatory Visit: Payer: BLUE CROSS/BLUE SHIELD | Admitting: Nutrition

## 2021-11-03 ENCOUNTER — Other Ambulatory Visit: Payer: Self-pay

## 2021-11-03 ENCOUNTER — Encounter: Payer: BC Managed Care – PPO | Attending: Family Medicine | Admitting: Nutrition

## 2021-11-03 ENCOUNTER — Encounter: Payer: Self-pay | Admitting: Nutrition

## 2021-11-03 VITALS — Ht 66.0 in | Wt 236.0 lb

## 2021-11-03 DIAGNOSIS — Z6838 Body mass index (BMI) 38.0-38.9, adult: Secondary | ICD-10-CM | POA: Insufficient documentation

## 2021-11-03 DIAGNOSIS — E118 Type 2 diabetes mellitus with unspecified complications: Secondary | ICD-10-CM | POA: Insufficient documentation

## 2021-11-03 DIAGNOSIS — E669 Obesity, unspecified: Secondary | ICD-10-CM | POA: Insufficient documentation

## 2021-11-03 NOTE — Progress Notes (Signed)
[facility] Employee [phone] ?Appointment Start time:  [place]  Appointment End time:  2706 ? ?Primary concerns today: DIabetes Type 2, Obesity  ?Referral diagnosis: E11. ?Preferred learning style: No preferece ?Learning readiness: Change in progress  ? ? ?NUTRITION ASSESSMENT  ?Type 2 51 yr old female recently diagnosed with Type 2 DM. ? Had gestational Dm in 2003, 2011. 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 ?Dx January 10th. Had proteinuria. Marland Kitchen ?Lost has 30 lbs since her diagnosis. ? ? A1C 11.3%. in January. Taking Ozempic weekly. ?Has cut out eating foods that are white, cut down bread, reducing carbs,  ?No sodas, just drinking water and coffee. Has been using Keto bars at times for meals when she doesn't have anything or protein shakes. Working on [employer] better. ?FBS 90-109 mg/dl, Bedtime 100-160's. ?Has been testing 4 times per day but since her BS are so much better and WNL, I have advised her to check it just twice a day, before breakfast and before bedtime. ? ?She is motivated to make lifestyle changes to improve her DM and reduce complications. She has done very well getting processed foods out of her diet.  ? ?She has been working on planning meals better. Still room for improvement. ? ?Current diet is insuffient to meet her  nutritional needs for desired weight loss and improved long term blood sugars. ? ?Overall, she is doing excellent and her BS are responding with dietary changes and having ozempic on board. She would like to get off ozempic as she gets more control of her lifestyle. ? ? ?Anthropometrics  ?Wt Readings from Last 3 Encounters:  ?11/03/21 236 lb (107 kg)  ?01/22/18 253 lb (114.8 kg)  ?01/10/18 253 lb 8 oz (115 kg)  ? ?Ht Readings from Last 3 Encounters:  ?11/03/21 '5\' 6"'$  (1.676 m)  ?01/22/18 '5\' 4"'$  (1.626 m)  ?01/10/18 '5\' 4"'$  (1.626 m)  ? ?Body mass index is 38.09 kg/m?. ?'@BMIFA'$ @ ?Facility age limit for growth percentiles is 20 years. ?Facility age limit  for growth percentiles is 20 years. ?  ? ?Clinical ?Medical Hx: See chart ?Medications: Metformin 500 mg  and Ozempic weekly. ?Labs: A1C 11.3% ?Notable Signs/Symptoms: none ? 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 ?Trying to work on eating better meals and making better lifestyle choices. ? ?Estimated daily fluid intake: 80-100 oz ?Supplements:  ?Sleep: varies ?Stress / self-care: her health, work ?Current average weekly physical activity: walks a lot on her job ? ?24-Hr Dietary Recall ?First Meal: 9 am Eggs, bacon, ?Snack:  ?Second Meal: 3 pm drank protein shake,  ?Snack:  ?Third Meal: Adkins bar,  ?Snack:  ?Beverages: water ? ?Estimated Energy Needs ?Calories: 1200 ?Carbohydrate: 135g ?Protein: 90g ?Fat: 33g ? ? ?NUTRITION DIAGNOSIS  ?NB-1.1 Food and nutrition-related knowledge deficit As related to Diabetes Type 2.  As evidenced by A1C 11.3%. ? ? ?NUTRITION INTERVENTION  ?Nutrition education (E-1) on the following topics:  ?Nutrition and Diabetes education provided on My Plate, CHO counting, meal planning, portion sizes, timing of meals, avoiding snacks between meals unless having a low blood sugar, target ranges for A1C and blood sugars, signs/symptoms and treatment of hyper/hypoglycemia, monitoring blood sugars, taking medications as prescribed, benefits of exercising 30 minutes per day and prevention of complications of DM. ? ?Lifestyle Medicine ?- Whole Food, Plant Predominant Nutrition is highly recommended: Eat Plenty of vegetables, Mushrooms, fruits, Legumes, Whole Grains, Nuts, seeds in lieu  of processed meats, processed snacks/pastries red meat, poultry, eggs.  ?  ?-It is better to avoid simple carbohydrates including: Cakes, Sweet Desserts, Ice Cream, Soda (diet and regular), Sweet Tea, Candies, Chips, Cookies, Store Bought Juices, Alcohol in Excess of  1-2 drinks a day, Lemonade,  Artificial Sweeteners, Doughnuts, Coffee Creamers, "Sugar-free" Products, etc, etc.  This  is not a complete list..... ? ?Exercise: If you are able: 30 -60 minutes a day ,4 days a week, or 150 minutes a week.  The longer the better.  Combine stretch, strength, and aerobic activities.  If you were told in the past that you have high risk for cardiovascular diseases, you may seek evaluation by your heart doctor prior to initiating moderate to intense exercise programs ? ?Handouts Provided Include  ?Lifestyle medicine meal plan ?Full plate living handout ?Meal Plan Card ? ? ?Learning Style & Readiness for Change ?Teaching method utilized: Visual & Auditory  ?Demonstrated degree of understanding via: Teach Back  ?Barriers to learning/adherence to lifestyle change: none ? ?Goals Established by Pt ?Goals ? ?Plant meals better ?Eat 3 meals per day ?Focus more on plant based foods ?Eat 30-45 g CHO per meal. ?Look into overnight oats  ?Increase fiber rich foods ?Get A1C to 7% ? ?Lifestyle Medicine ?- Whole Food, Plant Predominant Nutrition is highly recommended: Eat Plenty of vegetables, Mushrooms, fruits, Legumes, Whole Grains, Nuts, seeds in lieu of processed meats, processed snacks/pastries red meat, poultry, eggs.  ?  ?-It is better to avoid simple carbohydrates including: Cakes, Sweet Desserts, Ice Cream, Soda (diet and regular), Sweet Tea, Candies, Chips, Cookies, Store Bought Juices, Alcohol in Excess of  1-2 drinks a day, Lemonade,  Artificial Sweeteners, Doughnuts, Coffee Creamers, "Sugar-free" Products, etc, etc.  This is not a complete list..... ? ?Exercise: If you are able: 30 -60 minutes a day ,4 days a week, or 150 minutes a week.  The longer the better.  Combine stretch, strength, and aerobic activities.  If you were told in the past that you have high risk for cardiovascular diseases, you may seek evaluation by your heart doctor prior to initiating moderate to intense exercise programs ? ? ?MONITORING & EVALUATION ?Dietary intake, weekly physical activity, and blood sugars in 1 month. ? ?Next Steps   ?Patient is to work on meal prepping and planning. ?

## 2021-11-03 NOTE — Patient Instructions (Addendum)
Goals ? ?Plant meals better ?Eat 3 meals per day ?Focus more on plant based foods ?Eat 30-45 g CHO per meal. ?Look into overnight oats  ?Increase fiber rich foods ?Get A1C to 7% ? ?Lifestyle Medicine ?- Whole Food, Plant Predominant Nutrition is highly recommended: Eat Plenty of vegetables, Mushrooms, fruits, Legumes, Whole Grains, Nuts, seeds in lieu of processed meats, processed snacks/pastries red meat, poultry, eggs.  ?  ?-It is better to avoid simple carbohydrates including: Cakes, Sweet Desserts, Ice Cream, Soda (diet and regular), Sweet Tea, Candies, Chips, Cookies, Store Bought Juices, Alcohol in Excess of  1-2 drinks a day, Lemonade,  Artificial Sweeteners, Doughnuts, Coffee Creamers, "Sugar-free" Products, etc, etc.  This is not a complete list..... ? ?Exercise: If you are able: 30 -60 minutes a day ,4 days a week, or 150 minutes a week.  The longer the better.  Combine stretch, strength, and aerobic activities.  If you were told in the past that you have high risk for cardiovascular diseases, you may seek evaluation by your heart doctor prior to initiating moderate to intense exercise programs ?

## 2021-12-01 ENCOUNTER — Encounter: Payer: BC Managed Care – PPO | Attending: Obstetrics and Gynecology | Admitting: Nutrition

## 2021-12-01 ENCOUNTER — Encounter: Payer: Self-pay | Admitting: Nutrition

## 2021-12-01 VITALS — Ht 66.0 in | Wt 227.0 lb

## 2021-12-01 DIAGNOSIS — E669 Obesity, unspecified: Secondary | ICD-10-CM | POA: Insufficient documentation

## 2021-12-01 DIAGNOSIS — E118 Type 2 diabetes mellitus with unspecified complications: Secondary | ICD-10-CM | POA: Insufficient documentation

## 2021-12-01 NOTE — Progress Notes (Signed)
[facility]  0915   End 945 ? ?Primary concerns today: DIabetes Type 2, Obesity  ?Referral diagnosis: E11. ?Preferred learning style: No preferece ?Learning readiness: Change in progress  ? ? ?NUTRITION ASSESSMENT  ?Type 2 51 yr old female recently diagnosed with Type 2 DM. ? ?Lost 9 lbs since last visit a month ago.Marland Kitchen Has lost 33 lbs since January 10th til now.  ?Changes: Eating more plant based foods.  Has been more intentional and mindful of what she is eating. Eating meals on time. Watching her portion sizes. ?Cut out processed foods, sugary beverages, only drinking water now. ?Not eating snacks and eating plant based whole foods. ? ?BS's are 90-110's in am and at bedtime. BS have improved drastically! ?Currently taking Ozemipc and Metformin. ?Testing blood sugars a few times a week now. ? ?Feels better. Clothes fit better. People are noticing her weight loss. ?Has more energy and sleeping better. ? ?Est A1C based on BS readings is between 5-6%. Last A1C was 11% in January 2023. ? ?She is now motivated to work on the exercise component to further weight loss and tone up. ? ? A1C 11.3%. in January. Taking Ozempic weekly and Metformin 500 mg once a day in am.  ? ?She is motivated to make lifestyle changes to improve her DM and reduce complications. She has done very well getting processed foods out of her diet.  ? ?Overall, she is doing excellent and her BS are responding with dietary changes and having ozempic on board. She would like to get off ozempic as she gets more control of her lifestyle. ? ? ?Anthropometrics  ?Wt Readings from Last 3 Encounters:  ?11/03/21 236 lb (107 kg)  ?01/22/18 253 lb (114.8 kg)  ?01/10/18 253 lb 8 oz (115 kg)  ? ?Ht Readings from Last 3 Encounters:  ?11/03/21 '5\' 6"'$  (1.676 m)  ?01/22/18 '5\' 4"'$  (1.626 m)  ?01/10/18 '5\' 4"'$  (1.626 m)  ? ?There is no height or weight on file to calculate BMI. ?'@BMIFA'$ @ ?Facility age limit for growth percentiles is 20 years. ?Facility age limit  for growth percentiles is 20 years. ?  ? ?Clinical ?Medical Hx: See chart ?Medications: Metformin 500 mg  and Ozempic weekly. ?Labs: A1C 11.3% ?Notable Signs/Symptoms: none ? ?Lifestyle & Dietary Hx ?Lives with her husband and family. 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 ? ? ?Has cut out red meat. ? ?Estimated daily fluid intake: 80-100 oz ?Supplements:  ?Sleep: varies ?Stress / self-care: her health, work ?Current average weekly physical activity: walks a lot on her job ? ?24-Hr Dietary Recall ?B) egg beaters, apple or coffee ?Has cut out caffeine, Lemon juice in water ?L) salmon or baked chicken, black eyed peas, string beans, water ?D) String beans, corn, slice of ham from easter, water, strawberries. ? ?Estimated Energy Needs ?Calories: 1200 ?Carbohydrate: 135g ?Protein: 90g ?Fat: 33g ? ? ?NUTRITION DIAGNOSIS  ?NB-1.1 Food and nutrition-related knowledge deficit As related to Diabetes Type 2.  As evidenced by A1C 11.3%. ? ? ?NUTRITION INTERVENTION  ?Nutrition education (E-1) on the following topics:  ?Nutrition and Diabetes education provided on My Plate, CHO counting, meal planning, portion sizes, timing of meals, avoiding snacks between meals unless having a low blood sugar, target ranges for A1C and blood sugars, signs/symptoms and treatment of hyper/hypoglycemia, monitoring blood sugars, taking medications as prescribed, benefits of exercising 30 minutes per day and prevention of complications of DM. ? ?Lifestyle Medicine ?- Whole Food, Plant Predominant Nutrition is highly recommended: Eat [employer]  of vegetables, Mushrooms, fruits, Legumes, Whole Grains, Nuts, seeds in lieu of processed meats, processed snacks/pastries red meat, poultry, eggs.  ?  ?-It is better to avoid simple carbohydrates including: Cakes, Sweet Desserts, Ice Cream, Soda (diet and regular), Sweet Tea, Candies, Chips, Cookies, Store Bought Juices, Alcohol in Excess of  1-2 drinks a day, Lemonade,  Artificial Sweeteners, Doughnuts, Coffee  Creamers, "Sugar-free" Products, etc, etc.  This is not a complete list..... ? ?Exercise: If you are able: 30 -60 minutes a day ,4 days a week, or 150 minutes a week.  The longer the better.  Combine stretch, strength, and aerobic activities.  If you were told in the past that you have high risk for cardiovascular diseases, you may seek evaluation by your heart doctor prior to initiating moderate to intense exercise programs ? ?Handouts Provided Include  ?Lifestyle medicine meal plan ?Full plate living handout ?Meal Plan Card ? ? ?Learning Style & Readiness for Change ?Teaching method utilized: Visual & Auditory  ?Demonstrated degree of understanding via: Teach Back  ?Barriers to learning/adherence to lifestyle change: none ? ?Goals Established by Pt ? ?Keep up the great job!! ?Continue to increase plant based foods ?Drink 6 bottles of water per day. ?Will start exercise 150 minutes per week ?Lose 2 per month. ?Get A1C 6% or less ? ?Lifestyle Medicine ?- Whole Food, Plant Predominant Nutrition is highly recommended: Eat Plenty of vegetables, Mushrooms, fruits, Legumes, Whole Grains, Nuts, seeds in lieu of processed meats, processed snacks/pastries red meat, poultry, eggs.  ?  ?-It is better to avoid simple carbohydrates including: Cakes, Sweet Desserts, Ice Cream, Soda (diet and regular), Sweet Tea, Candies, Chips, Cookies, Store Bought Juices, Alcohol in Excess of  1-2 drinks a day, Lemonade,  Artificial Sweeteners, Doughnuts, Coffee Creamers, "Sugar-free" Products, etc, etc.  This is not a complete list..... ? ?Exercise: If you are able: 30 -60 minutes a day ,4 days a week, or 150 minutes a week.  The longer the better.  Combine stretch, strength, and aerobic activities.  If you were told in the past that you have high risk for cardiovascular diseases, you may seek evaluation by your heart doctor prior to initiating moderate to intense exercise programs ? ? ?MONITORING & EVALUATION ?Dietary intake, weekly physical  activity, and blood sugars in 3  month. ? ?Next Steps  ?Patient is to work on meal prepping and planning. ?

## 2021-12-01 NOTE — Patient Instructions (Addendum)
Goals ? ?Keep up the great job!! ?Continue to increase plant based foods ?Drink 6 bottles of water per day. ?Will start exercise 150 minutes per week ?Lose 2 per month. ?Get A1C 6% or less ? ? ?

## 2021-12-09 LAB — LIPID PANEL
Cholesterol: 171 (ref 0–200)
HDL: 51 (ref 35–70)
LDL Cholesterol: 111
Triglycerides: 56 (ref 40–160)

## 2021-12-09 LAB — BASIC METABOLIC PANEL
BUN: 12 (ref 4–21)
CO2: 30 — AB (ref 13–22)
Chloride: 104 (ref 99–108)
Creatinine: 0.9 (ref 0.5–1.1)
Glucose: 89
Potassium: 4.4 mEq/L (ref 3.5–5.1)
Sodium: 143 (ref 137–147)

## 2021-12-09 LAB — HEPATIC FUNCTION PANEL
ALT: 21 U/L (ref 7–35)
AST: 22 (ref 13–35)
Alkaline Phosphatase: 51 (ref 25–125)
Bilirubin, Total: 0.6

## 2021-12-09 LAB — COMPREHENSIVE METABOLIC PANEL
Albumin: 4.4 (ref 3.5–5.0)
Calcium: 9.5 (ref 8.7–10.7)

## 2021-12-09 LAB — HEMOGLOBIN A1C: Hemoglobin A1C: 6.2

## 2022-02-10 ENCOUNTER — Ambulatory Visit: Payer: Self-pay | Admitting: Nurse Practitioner

## 2022-03-02 ENCOUNTER — Ambulatory Visit: Payer: BC Managed Care – PPO | Admitting: Nutrition

## 2022-03-09 ENCOUNTER — Ambulatory Visit: Payer: BC Managed Care – PPO | Admitting: "Endocrinology

## 2022-03-09 ENCOUNTER — Encounter: Payer: BC Managed Care – PPO | Attending: Obstetrics and Gynecology | Admitting: Nutrition

## 2022-03-09 ENCOUNTER — Encounter: Payer: Self-pay | Admitting: "Endocrinology

## 2022-03-09 VITALS — Ht 66.0 in | Wt 214.8 lb

## 2022-03-09 VITALS — BP 112/82 | HR 76 | Ht 64.5 in | Wt 214.0 lb

## 2022-03-09 DIAGNOSIS — E119 Type 2 diabetes mellitus without complications: Secondary | ICD-10-CM | POA: Insufficient documentation

## 2022-03-09 DIAGNOSIS — Z713 Dietary counseling and surveillance: Secondary | ICD-10-CM | POA: Insufficient documentation

## 2022-03-09 DIAGNOSIS — E118 Type 2 diabetes mellitus with unspecified complications: Secondary | ICD-10-CM

## 2022-03-09 DIAGNOSIS — E669 Obesity, unspecified: Secondary | ICD-10-CM

## 2022-03-09 DIAGNOSIS — Z6836 Body mass index (BMI) 36.0-36.9, adult: Secondary | ICD-10-CM

## 2022-03-09 LAB — POCT GLYCOSYLATED HEMOGLOBIN (HGB A1C): HbA1c, POC (controlled diabetic range): 5.3 % (ref 0.0–7.0)

## 2022-03-09 NOTE — Progress Notes (Signed)
Endocrinology Consult Note       03/09/2022, 1:14 PM   Subjective:    Patient ID: Kathy Bowers, female    DOB: 24-Feb-1971.  Tauheedah Bok is being seen in consultation for management of currently uncontrolled symptomatic diabetes requested by  Marylynn Pearson, MD.   Past Medical History:  Diagnosis Date   Anemia    Diabetes mellitus, type II ([place])    Goiter    Md watching - recent lab normal   Hypertension    Morbid obesity ([place])    SVD (spontaneous vaginal delivery)    x 2    Past Surgical History:  Procedure Laterality Date   DILITATION & CURRETTAGE/HYSTROSCOPY WITH NOVASURE ABLATION     LAPAROSCOPIC VAGINAL HYSTERECTOMY WITH SALPINGECTOMY Bilateral 01/22/2018   Procedure: LAPAROSCOPIC ASSISTED VAGINAL HYSTERECTOMY WITH SALPINGECTOMY;  Surgeon: Marylynn Pearson, MD;  Location: [facility];  Service: Gynecology;  Laterality: Bilateral;   TUBAL LIGATION  04/2010   WISDOM TOOTH EXTRACTION     WISDOM TOOTH EXTRACTION      Social History   Socioeconomic History   Marital status: Married    Spouse name: Not on file   Number of children: Not on file   Years of education: Not on file   Highest education level: Not on file  Occupational History   Not on file  Tobacco Use   Smoking status: Never   Smokeless tobacco: Never  Vaping Use   Vaping Use: Never used  Substance and Sexual Activity   Alcohol use: No   Drug use: No   Sexual activity: Yes    Birth control/protection: Surgical    Comment: NovaSure Ablation  Other Topics Concern   Not on file  Social History Narrative   Not on file   Social Determinants of Health   Financial Resource Strain: Not on file  Food Insecurity: Not on file  Transportation Needs: Not on file  Physical Activity: Not on file  Stress: Not on file  Social Connections: Not on file    Family History  Problem Relation Age of Onset   Kidney disease Mother     Hypertension Mother    Diabetes Mother    Hypertension Father    Heart disease Father     Outpatient Encounter Medications as of 03/09/2022  Medication Sig   cholecalciferol (VITAMIN D) 1000 units tablet Take 1,000 Units by mouth daily.   Probiotic Product (PROBIOTIC PO) Take 1 tablet by mouth daily.   calcium carbonate (TUMS - DOSED IN MG ELEMENTAL CALCIUM) 500 MG chewable tablet Chew 1 tablet by mouth daily as needed for indigestion or heartburn. (Patient not taking: Reported on 03/09/2022)   HYDROcodone-acetaminophen (NORCO/VICODIN) 5-325 MG tablet Take 1-2 tablets by mouth every 4 (four) hours as needed for moderate pain. (Patient not taking: Reported on 03/09/2022)   ibuprofen (ADVIL,MOTRIN) 600 MG tablet Take 1 tablet (600 mg total) by mouth every 6 (six) hours as needed for moderate pain (after completes toradol).   metFORMIN (GLUMETZA) 500 MG (MOD) 24 hr tablet Take 500 mg by mouth daily with breakfast.   Multiple Vitamins-Minerals (HAIR SKIN AND NAILS FORMULA) TABS Take 3  tablets by mouth daily.   Semaglutide,0.25 or 0.'5MG'$ /DOS, (OZEMPIC, 0.25 OR 0.5 MG/DOSE,) 2 MG/3ML SOPN Inject 0.5 mg into the skin.   TURMERIC CURCUMIN PO Take 750 mg by mouth daily. (Patient not taking: Reported on 03/09/2022)   No facility-administered encounter medications on file as of 03/09/2022.    ALLERGIES: Allergies  Allergen Reactions   Fentanyl     VACCINATION STATUS: Immunization History  Administered Date(s) Administered   PFIZER(Purple Top)SARS-COV-2 Vaccination 08/12/2019, 09/02/2019, 06/08/2020    Diabetes She presents for her initial diabetic visit. She has type 2 diabetes mellitus. Onset time: She was diagnosed at approximate age of 57 years. Her disease course has been improving. There are no hypoglycemic associated symptoms. Pertinent negatives for hypoglycemia include no confusion, headaches, pallor or seizures. There are no diabetic associated symptoms. Pertinent negatives for diabetes  include no chest pain, no polydipsia, no polyphagia and no polyuria. There are no hypoglycemic complications. Symptoms are improving. There are no diabetic complications. Risk factors for coronary artery disease include diabetes mellitus, obesity, family history and dyslipidemia. Current diabetic treatments: She is on Ozempic 0.5 mg subcutaneously daily, and metformin 500 mg XR p.o. daily at breakfast. She is compliant with treatment most of the time. Her weight is decreasing steadily (By changing her diet, she has already achieved 41 pounds of weight loss.). She is following a generally unhealthy diet. She has had a previous visit with a dietitian. She participates in exercise intermittently. Her home blood glucose trend is decreasing steadily. (She was diagnosed with diabetes with A1c of 11.7%, subsequently engaged and changed her diet to achieve a point of care A1c of 5.3% today.  Her blood glucose readings are on target.) An ACE inhibitor/angiotensin II receptor blocker is not being taken.  Hyperlipidemia This is a new problem. The problem is uncontrolled. Pertinent negatives include no chest pain, myalgias or shortness of breath. She is currently on no antihyperlipidemic treatment. Risk factors for coronary artery disease include dyslipidemia, diabetes mellitus, obesity and family history.     Review of Systems  Constitutional:  Negative for chills, fever and unexpected weight change.  HENT:  Negative for trouble swallowing and voice change.   Eyes:  Negative for visual disturbance.  Respiratory:  Negative for cough, shortness of breath and wheezing.   Cardiovascular:  Negative for chest pain, palpitations and leg swelling.  Gastrointestinal:  Negative for diarrhea, nausea and vomiting.  Endocrine: Negative for cold intolerance, heat intolerance, polydipsia, polyphagia and polyuria.  Musculoskeletal:  Negative for arthralgias and myalgias.  Skin:  Negative for color change, pallor, rash and  wound.  Neurological:  Negative for seizures and headaches.  Psychiatric/Behavioral:  Negative for confusion and suicidal ideas.     Objective:       03/09/2022    9:49 AM 03/09/2022    8:51 AM 12/01/2021    8:53 AM  Vitals with BMI  Height 5' 4.5" '5\' 6"'$  '5\' 6"'$   Weight 214 lbs 214 lbs 13 oz 227 lbs  BMI 36.18 27.06 23.76  Systolic 283    Diastolic 82    Pulse 76      BP 112/82   Pulse 76   Ht 5' 4.5" (1.638 m)   Wt 214 lb (97.1 kg)   BMI 36.17 kg/m   Wt Readings from Last 3 Encounters:  03/09/22 214 lb (97.1 kg)  03/09/22 214 lb 12.8 oz (97.4 kg)  12/01/21 227 lb (103 kg)     Physical Exam Constitutional:  Appearance: She is well-developed.  HENT:     Head: Normocephalic and atraumatic.  Neck:     Thyroid: No thyromegaly.     Trachea: No tracheal deviation.  Cardiovascular:     Rate and Rhythm: Normal rate and regular rhythm.  Pulmonary:     Effort: Pulmonary effort is normal.     Breath sounds: Normal breath sounds.  Abdominal:     General: Bowel sounds are normal.     Palpations: Abdomen is soft.     Tenderness: There is no abdominal tenderness. There is no guarding.  Musculoskeletal:        General: Normal range of motion.     Cervical back: Normal range of motion and neck supple.  Skin:    General: Skin is warm and dry.     Coloration: Skin is not pale.     Findings: No erythema or rash.  Neurological:     Mental Status: She is alert and oriented to person, place, and time.     Cranial Nerves: No cranial nerve deficit.     Coordination: Coordination normal.     Deep Tendon Reflexes: Reflexes are normal and symmetric.  Psychiatric:        Judgment: Judgment normal.       CMP ( most recent) CMP     Component Value Date/Time   NA 143 12/09/2021 0000   K 4.4 12/09/2021 0000   CL 104 12/09/2021 0000   CO2 30 (A) 12/09/2021 0000   GLUCOSE 94 01/10/2018 1302   BUN 12 12/09/2021 0000   CREATININE 0.9 12/09/2021 0000   CREATININE 0.81  01/10/2018 1302   CALCIUM 9.5 12/09/2021 0000   PROT 8.4 (H) 01/10/2018 1302   ALBUMIN 4.4 12/09/2021 0000   AST 22 12/09/2021 0000   ALT 21 12/09/2021 0000   ALKPHOS 51 12/09/2021 0000   BILITOT 0.8 01/10/2018 1302   GFRNONAA >60 01/10/2018 1302   GFRAA >60 01/10/2018 1302     Diabetic Labs (most recent): Lab Results  Component Value Date   HGBA1C 5.3 03/09/2022   HGBA1C 6.2 12/09/2021     Lipid Panel ( most recent) Lipid Panel     Component Value Date/Time   CHOL 171 12/09/2021 0000   TRIG 56 12/09/2021 0000   HDL 51 12/09/2021 0000   LDLCALC 111 12/09/2021 0000     Assessment & Plan:   1. Type 2 diabetes mellitus without complication, without long-term current use of insulin (HCC)  - Keyana Ekdahl has currently uncontrolled symptomatic type 2 DM since  52 years of age. She has achieved A1c dropping from 11.7% to 5.3% today. Recent labs reviewed. -He has engaged with lifestyle change after consulting with Neomia Dear, CDE.  I had a long discussion with her about the progressive nature of diabetes and the pathology behind its complications. -She has no gross complication from diabetes, however she remains at a high risk for more acute and chronic complications which include CAD, CVA, CKD, retinopathy, and neuropathy. These are all discussed in detail with her.  - I discussed all available options of managing her diabetes including de-escalation of medications. I have counseled her on diet  and weight management  by adopting a Whole Food , Plant Predominant  ( WFPP) nutrition as recommended by [employer] of Lifestyle Medicine. Patient is encouraged to switch to  unprocessed or minimally processed  complex starch, adequate protein intake (mainly plant source), minimal liquid fat ( mainly vegetable oils), plenty of fruits, and  vegetables. -  she is advised to stick to a routine mealtimes to eat 3 complete meals a day and snack only when necessary ( to snack only to  correct hypoglycemia BG <70 day time or <100 at night).   - she acknowledges that there is a room for improvement in her food and drink choices. - Further Specific Suggestion is made for her to avoid simple carbohydrates  from her diet including Cakes, Sweet Desserts, Ice Cream, Soda (diet and regular), Sweet Tea, Candies, Chips, Cookies, Store Bought Juices, Alcohol ,  Artificial Sweeteners,  Coffee Creamer, and "Sugar-free" Products. This will help patient to have more stable blood glucose profile and potentially avoid unintended weight gain.  The following Lifestyle Medicine recommendations according to [facility]) were discussed and offered to patient and she agrees to start the journey:  A. Whole Foods, Plant-based plate comprising of fruits and vegetables, plant-based proteins, whole-grain carbohydrates was discussed in detail with the patient.   A list for source of those nutrients were also provided to the patient.  Patient will use only water or unsweetened tea for hydration. B.  The need to stay away from risky substances including alcohol, smoking; obtaining 7 to 9 hours of restorative sleep, at least 150 minutes of moderate intensity exercise weekly, the importance of healthy social connections,  and stress reduction techniques were discussed. C.  A full color page of  Calorie density of various food groups per pound showing examples of each food groups was provided to the patient.  - she will be scheduled with Jearld Fenton, RDN, CDE for individualized diabetes education.  - I have approached her with the following plan to manage  her diabetes and patient agrees:   - she will be continued on metformin 500 mg XR p.o. daily at breakfast.   -She is also allowed to continue her Ozempic 0.5 mg subcutaneously weekly until she finishes her current supplies.  She has a chance to come off of Ozempic if she reverses her diabetes.   She is encouraged to continue  to monitor blood glucose at fasting. She will not need insulin or other additional treatment for now.  - she is encouraged to call clinic for blood glucose levels less than 70 or above 200 mg /dl.  - Specific targets for  A1c;  LDL, HDL,  and Triglycerides were discussed with the patient.  2) Blood Pressure /Hypertension:  her blood pressure is  controlled to target.  She is not on antihypertensive medications.   3) Lipids/Hyperlipidemia:   Review of her recent lipid panel showed un controlled  LDL at 111 .  she  is not on statins.  She will have repeat lipid panel before her next visit.  Should be considered for intervention only if her LDL remains above 100 mg per DL.    4)  Weight/Diet:  Body mass index is 36.17 kg/m.  -Achieved 41 pounds of weight loss already,   she is  a candidate for more  weight loss. I discussed with her the fact that loss of 5 - 10% of her  current body weight will have the most impact on her diabetes management.  The above detailed  ACLM recommendations for nutrition, exercise, sleep, social life, avoidance of risky substances, the need for restorative sleep   information will also detailed on discharge instructions.  5) Chronic Care/Health Maintenance:  -she is  encouraged to initiate and continue to follow up with Ophthalmology, Dentist,  Podiatrist  at least yearly or according to recommendations, and advised to   stay away from smoking. I have recommended yearly flu vaccine and pneumonia vaccine at least every 5 years; moderate intensity exercise for up to 150 minutes weekly; and  sleep for 7- 9 hours a day.  - she is  advised to maintain close follow up with Marylynn Pearson, MD for primary care needs, as well as her other providers for optimal and coordinated care.   I spent 51 minutes in the care of the patient today including review of labs from [facility], Lipids, Thyroid Function, Hematology (current and previous including abstractions from other facilities);  face-to-face time discussing  her blood glucose readings/logs, discussing hypoglycemia and hyperglycemia episodes and symptoms, medications doses, her options of short and long term treatment based on the latest standards of care / guidelines;  discussion about incorporating lifestyle medicine;  and documenting the encounter. Risk reduction counseling performed per USPSTF guidelines to reduce obesity and cardiovascular risk factors.      Please refer to Patient Instructions for Blood Glucose Monitoring and Insulin/Medications Dosing Guide"  in media tab for additional information. Please  also refer to " Patient Self Inventory" in the Media  tab for reviewed elements of pertinent patient history.  Kathy Bowers participated in the discussions, expressed understanding, and voiced agreement with the above plans.  All questions were answered to her satisfaction. she is encouraged to contact clinic should she have any questions or concerns prior to her return visit.   Follow up plan: - Return in about 3 months (around 06/09/2022) for F/U with Pre-visit Labs, Meter/CGM/Logs, A1c here.  Glade Lloyd, MD [facility] [address] [place], [place] [address] Phone: [phone]  Fax: [phone]    03/09/2022, 1:14 PM  This note was partially dictated with voice recognition software. Similar sounding words can be transcribed inadequately or may not  be corrected upon review.

## 2022-03-09 NOTE — Patient Instructions (Signed)

## 2022-03-09 NOTE — Progress Notes (Signed)
Medical Nutrition Therapy  [phone]   0930 Primary concerns today: DIabetes Type 2, Obesity  Referral diagnosis: E11. Preferred learning style: No preferece Learning readiness: Change in progress    NUTRITION ASSESSMENT  DM Follow up  51 yr old female recently diagnosed with Type 2 DM. Lost 13 lbs last visit.- 3 months ago. She is making better choices. Has avoided processed foods. Eating more plant based foods. More mindful about eating and portion control. A1C is down to 5.3% from >>>6.6>>>11.4% in January 2023. She has make significant changes with her diet, meal planning, and mindfulness towards her food choices.  She is a Marine scientist for [employer].   Feels so much better. Is no longer having pain in her joints. Not hungry for unnecessary snacks or foods. Chooses foods that require the use of a fork instead of a knife. Has cut out red meat and only eats fish primarily. Has been eating more fresh fruits, vegetables and whole grains. Cut out processed foods. Drinking only water. Goes next week to get her blood work done.  14 day avg 94 mg/dl   AM 90's usually  Bedtime:90 mg/dl.  FBS this am 98  mg/dl. A1C now down to 5.3%.  She has lost a total of 41 lbs in 6 months.Saw Dr. Dorris Fetch today and will stop Ozempi after completing what she has.  Ready to start adding exericise to her healthier lifestyle now.  Anthropometrics  Wt Readings from Last 3 Encounters:  12/01/21 227 lb (103 kg)  11/03/21 236 lb (107 kg)  01/22/18 253 lb (114.8 kg)   Ht Readings from Last 3 Encounters:  12/01/21 '5\' 6"'$  (1.676 m)  11/03/21 '5\' 6"'$  (1.676 m)  01/22/18 '5\' 4"'$  (1.626 m)   There is no height or weight on file to calculate BMI. '@BMIFA'$ @ Facility age limit for growth %iles is 20 years. Facility age limit for growth %iles is 20 years.    Clinical Medical Hx: See chart Medications: Metformin 500 mg ; will stop Ozempic after this dose.. Labs: A1C 5.3% Notable Signs/Symptoms: none  Lifestyle & Dietary  Hx Lives with her husband and family. 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  Estimated daily fluid intake: 80-100 oz Supplements:  Sleep: varies Stress / self-care: her health, work Current average weekly physical activity: walks a lot on her job  24-Hr Dietary Recall B) egg beaters, toast and grapes. Has cut out caffeine, Lemon juice in water L) Salad-lettuce, bacon bits, tomatoes, cheese, water, ginger, nuts Plant based protein drink D) Healthy Choices power bowl, Bedtime: Pb and crackers.  Estimated Energy Needs Calories: 1200 Carbohydrate: 135g Protein: 90g Fat: 33g   NUTRITION DIAGNOSIS  NB-1.1 Food and nutrition-related knowledge deficit As related to Diabetes Type 2.  As evidenced by A1C 11.3%.   NUTRITION INTERVENTION  Nutrition education (E-1) on the following topics:  Nutrition and Diabetes education provided on My Plate, CHO counting, meal planning, portion sizes, timing of meals, avoiding snacks between meals unless having a low blood sugar, target ranges for A1C and blood sugars, signs/symptoms and treatment of hyper/hypoglycemia, monitoring blood sugars, taking medications as prescribed, benefits of exercising 30 minutes per day and prevention of complications of DM.  Lifestyle Medicine - Whole Food, Plant Predominant Nutrition is highly recommended: Eat Plenty of vegetables, Mushrooms, fruits, Legumes, Whole Grains, Nuts, seeds in lieu of processed meats, processed snacks/pastries red meat, poultry, eggs.    -It is better to avoid simple carbohydrates including: Cakes, Sweet Desserts, Ice Cream, Soda (diet  and regular), Sweet Tea, Candies, Chips, Cookies, Store Bought Juices, Alcohol in Excess of  1-2 drinks a day, Lemonade,  Artificial Sweeteners, Doughnuts, Coffee Creamers, "Sugar-free" Products, etc, etc.  This is not a complete list.....  Exercise: If you are able: 30 -60 minutes a day ,4 days a week, or 150 minutes a week.  The longer the better.  Combine stretch,  strength, and aerobic activities.  If you were told in the past that you have high risk for cardiovascular diseases, you may seek evaluation by your heart doctor prior to initiating moderate to intense exercise programs  Handouts Provided Include  Lifestyle medicine meal plan Full plate living handout Meal Plan Card   Learning Style & Readiness for Change Teaching method utilized: Visual & Auditory  Demonstrated degree of understanding via: Teach Back  Barriers to learning/adherence to lifestyle change: none  Goals Established by Pt Goals  Get under 200 lbs in next  3- 6 months. Keep A1C under 6.5% Recommend to stop Metformin. Start working out 3 times per week.  Lifestyle Medicine - Whole Food, Plant Predominant Nutrition is highly recommended: Eat Plenty of vegetables, Mushrooms, fruits, Legumes, Whole Grains, Nuts, seeds in lieu of processed meats, processed snacks/pastries red meat, poultry, eggs.    -It is better to avoid simple carbohydrates including: Cakes, Sweet Desserts, Ice Cream, Soda (diet and regular), Sweet Tea, Candies, Chips, Cookies, Store Bought Juices, Alcohol in Excess of  1-2 drinks a day, Lemonade,  Artificial Sweeteners, Doughnuts, Coffee Creamers, "Sugar-free" Products, etc, etc.  This is not a complete list.....  Exercise: If you are able: 30 -60 minutes a day ,4 days a week, or 150 minutes a week.  The longer the better.  Combine stretch, strength, and aerobic activities.  If you were told in the past that you have high risk for cardiovascular diseases, you may seek evaluation by your heart doctor prior to initiating moderate to intense exercise programs   MONITORING & EVALUATION Dietary intake, weekly physical activity, and blood sugars in 3  month.  Next Steps  Patient is to work on meal prepping and planning.

## 2022-03-09 NOTE — Patient Instructions (Addendum)
Goals  Get under 200 lbs in next  3- 6 months. Keep A1C under 6.5% Recommend to stop Metformin. Start working out 3 times per week.

## 2022-03-10 ENCOUNTER — Encounter: Payer: Self-pay | Admitting: Nutrition

## 2022-06-15 ENCOUNTER — Ambulatory Visit: Payer: BC Managed Care – PPO | Admitting: "Endocrinology

## 2022-08-11 ENCOUNTER — Ambulatory Visit: Payer: BC Managed Care – PPO | Admitting: Nutrition
